# Patient Record
Sex: Male | Born: 1975
Health system: Midwestern US, Community
[De-identification: ages and names within clinical notes are randomized; demographics above are authoritative.]

## PROBLEM LIST (undated history)

## (undated) DIAGNOSIS — I639 Cerebral infarction, unspecified: Secondary | ICD-10-CM

## (undated) DIAGNOSIS — J45909 Unspecified asthma, uncomplicated: Secondary | ICD-10-CM

## (undated) DIAGNOSIS — R569 Unspecified convulsions: Secondary | ICD-10-CM

## (undated) HISTORY — PX: APPENDECTOMY: SHX54

---

## 2006-11-04 ENCOUNTER — Ambulatory Visit: Payer: Self-pay | Admitting: Psychiatry

## 2006-11-05 ENCOUNTER — Inpatient Hospital Stay (HOSPITAL_COMMUNITY): Admission: RE | Admit: 2006-11-05 | Discharge: 2006-11-07 | Payer: Self-pay | Admitting: Psychiatry

## 2007-04-04 ENCOUNTER — Inpatient Hospital Stay (HOSPITAL_COMMUNITY): Admission: RE | Admit: 2007-04-04 | Discharge: 2007-04-07 | Payer: Self-pay | Admitting: Psychiatry

## 2007-04-14 ENCOUNTER — Ambulatory Visit: Payer: Self-pay | Admitting: Psychiatry

## 2007-04-26 ENCOUNTER — Ambulatory Visit: Payer: Self-pay | Admitting: *Deleted

## 2007-04-26 ENCOUNTER — Emergency Department (HOSPITAL_COMMUNITY): Admission: EM | Admit: 2007-04-26 | Discharge: 2007-04-26 | Payer: Self-pay | Admitting: Internal Medicine

## 2007-04-26 ENCOUNTER — Inpatient Hospital Stay (HOSPITAL_COMMUNITY): Admission: AD | Admit: 2007-04-26 | Discharge: 2007-04-28 | Payer: Self-pay | Admitting: *Deleted

## 2008-04-27 IMAGING — CR DG CHEST 2V
2 series · 2 of 2 positions shown · non-contrast
Comparison: None

CLINICAL DATA: Suicidal.  Left chest pain.  History smoking.

CHEST - 2 VIEW

[w chest pa]
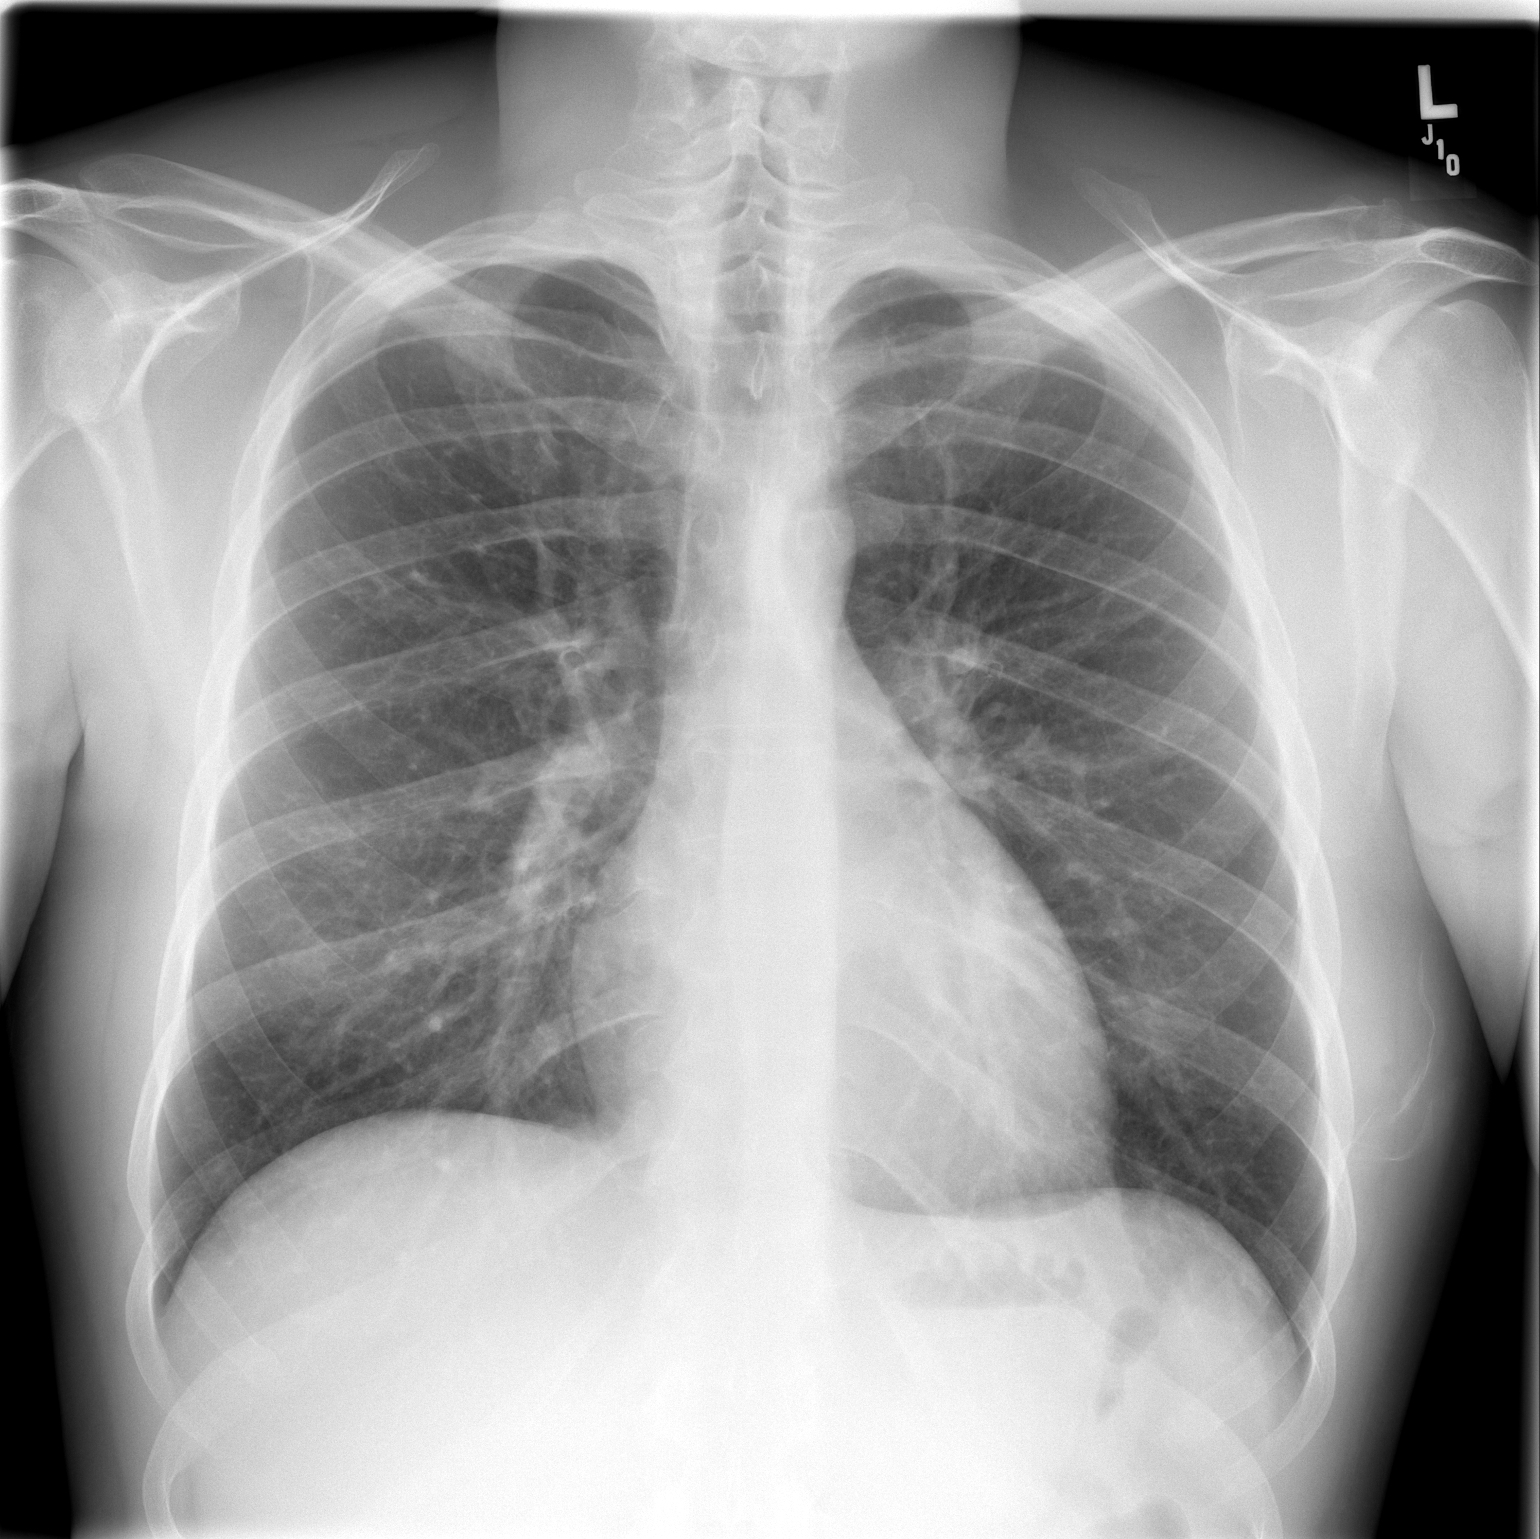

[w chest lat]
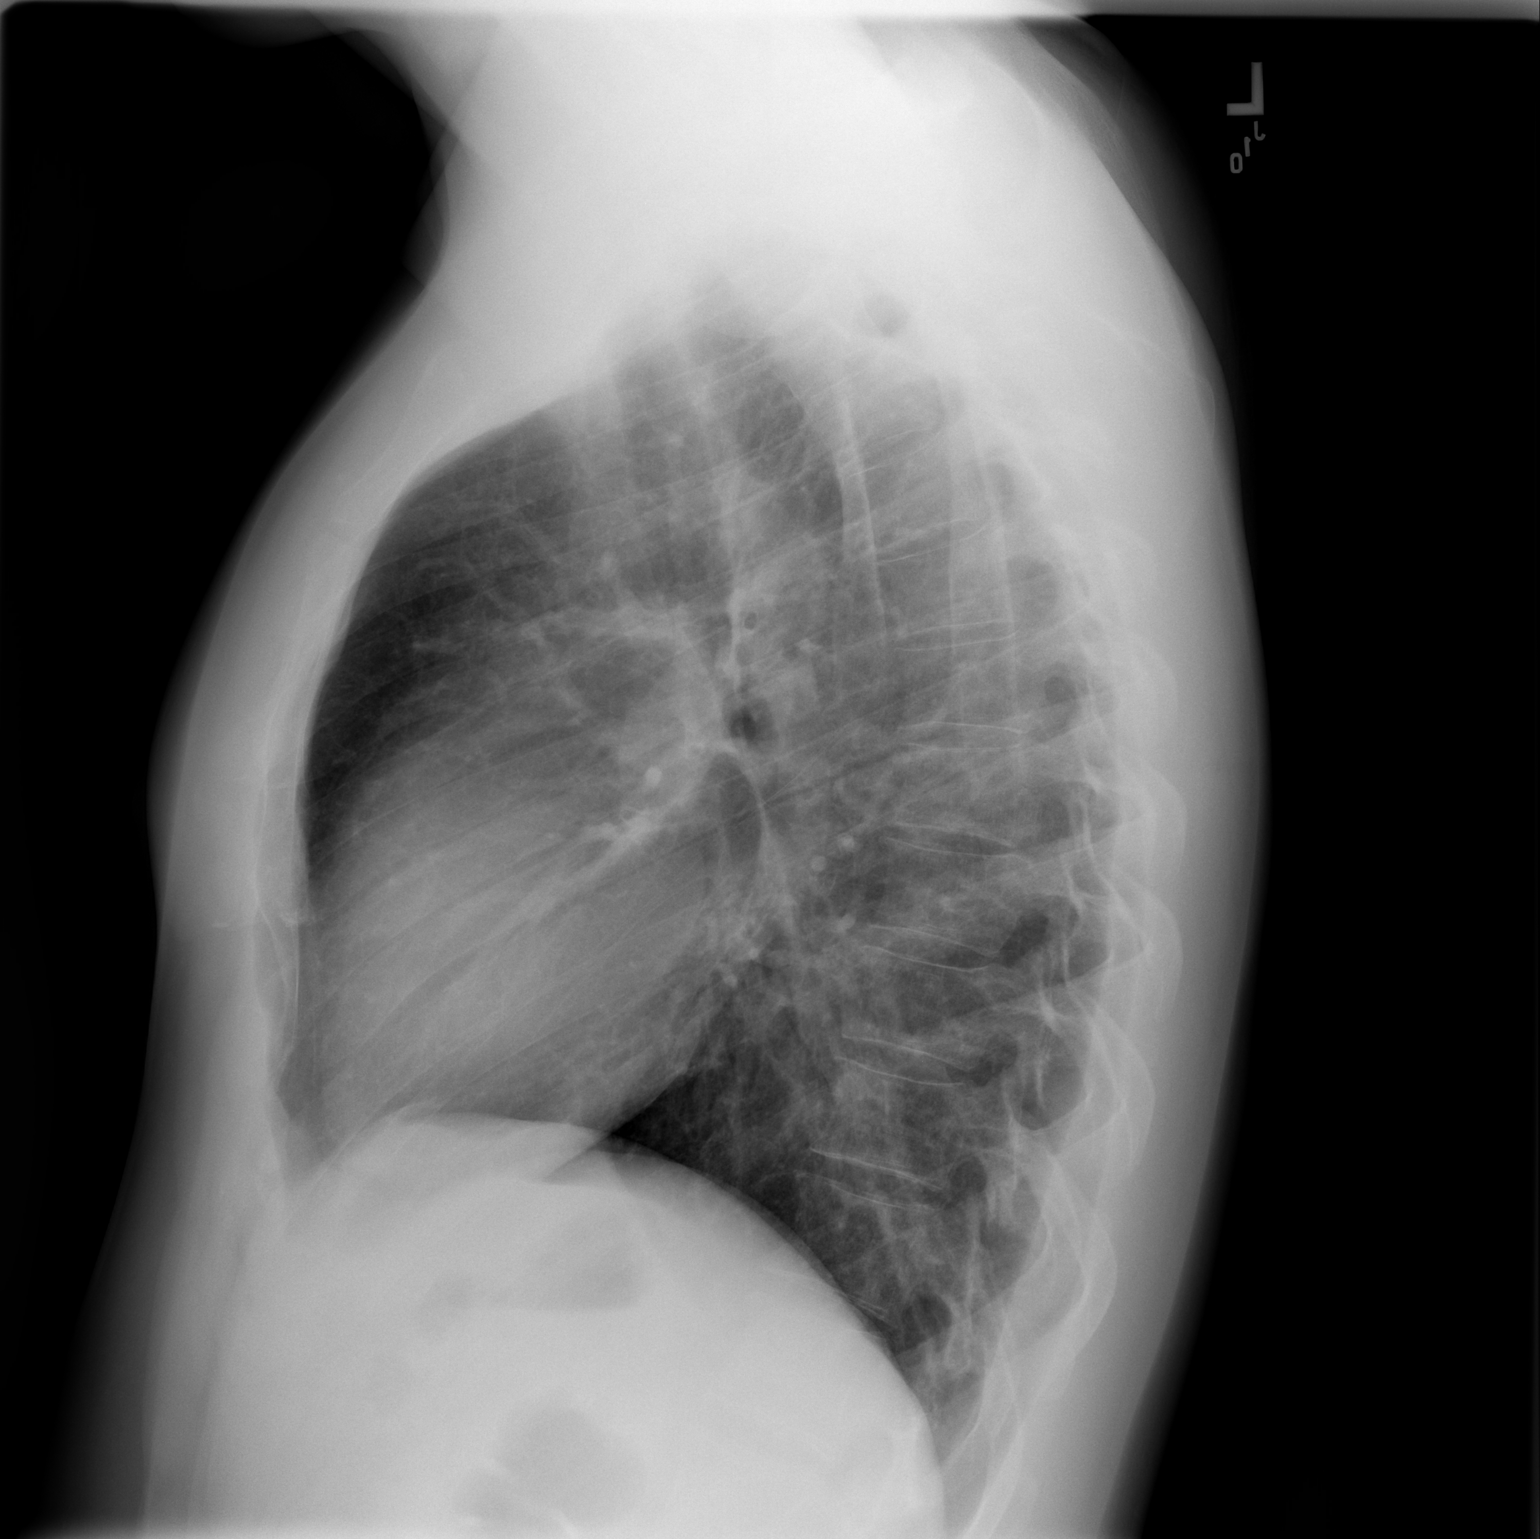

[2 of 2 positions shown; findings below may reference images not displayed]

FINDINGS: Midline trachea.  Normal heart size and mediastinal
contours.

Sharp costophrenic angles.  No pneumothorax.    Clear lungs.
IMPRESSION: No acute cardiopulmonary disease.

## 2010-05-19 NOTE — H&P (Signed)
Fernando [name], Fernando [name]                ACCOUNT NO.:  [id number]   MEDICAL RECORD NO.:  [id number]          PATIENT TYPE:  IPS   LOCATION:  0402                          FACILITY:  [facility]   PHYSICIAN:  Anselm Jungling, MD  DATE OF BIRTH:  11-29-1975   DATE OF ADMISSION:  11/05/2006  DATE OF DISCHARGE:                       PSYCHIATRIC ADMISSION ASSESSMENT   This is a voluntary admission to the services of Dr. Geralyn Flash.   This is a 35 year old single white male.  Apparently, he called 911  yesterday reporting that he was suicidal.  This was due to a recent job  lost and he lost his job because of his cocaine dependency.  He  presented to the [facility] for assessment and could not contact for  safety.  The sheriffs were called and he was brought here to the  [facility].   PAST PSYCHIATRIC HISTORY:  He has had prior inpatient stays at [facility] and [facility].  He was not forthcoming with anymore details.   SOCIAL HISTORY:  He is a high school graduate in 1993.  He has never  married.  He does have a 32-year-old son and a 25-year-old daughter.  He  has been employed as a Nutritional therapist.  He last worked approximately a month  ago.   FAMILY HISTORY:  He denies any history for mental illness.   ALCOHOL AND DRUG HISTORY:  He smokes 1 pack per day.  He, again, was not  forthcoming regarding his cocaine use but he reported a daily habit of  300 dollars a day recently.   PRIMARY CARE Alayzha An:  He does not have one.   MEDICAL PROBLEMS:  He is reported to have asthma.  No recent issues with  it.   MEDICATIONS:  He has been prescribed Seroquel and Prozac in the past  with benefit.  He has not taken in quite some time.   DRUG ALLERGIES:  HE HAS NO KNOWN DRUG ALLERGIES.   POSITIVE PHYSICAL FINDINGS:  Reveals a well-developed, well-nourished  male in no acute distress.  His vital signs on admission show he is 74  inches tall, weighs 184.5.  Temperature is 97.4.  Blood  pressure is  132/83 to 144/88.  Pulse was 65.  Respirations are 24.  He is status  post an appendectomy in 1993.  He does have occasional asthma issues but  he has not had any recent medications.   LABS:  His labs are pending.   MENTAL STATUS EXAM:  He is somewhat drowsy.  He is appropriately  groomed, dressed, and nourished.  His speech is a little slow.  His mood  is depressed.  He is somewhat withdrawn.  His affect is congruent.  Thought processes are clear, rational, and goal oriented.  He reports he  would like to get some help.  Judgment and insight are intact.  Concentration and memory are intact.  Intelligence is at least average.  He reports that he is having auditory hallucinations.  This consists of  screaming in his head.  He does feel suicidal.  He is not homicidal.  AXIS I DIAGNOSES:  1. Cocaine dependence.  2. Substance abuse-induced depression.   AXIS II:  Deferred.   AXIS III:  History of asthma.   AXIS IV:  Severe issues with occupational, housing, and economic issues.   PLAN:  Admit for safety and stabilization.  We will help support him  through his withdrawal from cocaine.  We will restart his medications.  Toward that end, he was started on Prozac 20 mg p.o. daily, Seroquel 100  mg at h.s., Seroquel 50 mg q.4. h. p.r.n.  He is hoping that we can help  him identify a long-term drug rehab program and that will be addressed  on Monday when the case managers return.   ESTIMATED LENGTH OF STAY:  Four to 5 days.      Mickie Leonarda Salon, P.A.-C.      Anselm Jungling, MD  Electronically Signed    MD/MEDQ  D:  11/05/2006  T:  11/06/2006  Job:  [id number]

## 2010-05-19 NOTE — Discharge Summary (Signed)
Fernando [name], TROSTEL                ACCOUNT NO.:  [id number]   MEDICAL RECORD NO.:  [id number]          PATIENT TYPE:  IPS   LOCATION:  0501                          FACILITY:  [facility]   PHYSICIAN:  Jasmine Pang, M.D. DATE OF BIRTH:  1975/12/08   DATE OF ADMISSION:  11/05/2006  DATE OF DISCHARGE:  11/07/2006                               DISCHARGE SUMMARY   IDENTIFICATION:  A 35 year old single white male who was admitted from  the [facility].   HISTORY OF PRESENT ILLNESS:  The patient called the Sheriff's office for  transport to the [facility].  He was having suicidal ideation.  He  states he had lost his job recently due to cocaine dependency.  Per  [facility], the patient could not contract for safety.  He had been  thinking of overdosing on his meds or sleeping pills.  He has been  admitted in the past at [facility] in [facility].  He  admits to using cocaine for several years.  He smokes 1-pack per day  cigarettes.  He does not currently have a psychiatrist.  He admits he  stopped taking his medications Seroquel and Prozac.  He states they were  helping him and he is unclear why he stopped them.  He has no known drug  allergies.   PHYSICAL FINDINGS:  There were no acute physical problems noted.   ADMISSION LABORATORIES:  Urine drug screen was positive for marijuana  and cocaine.  Urinalysis was negative.  TSH was within normal limits at  1.380.  Comprehensive metabolic panel was grossly within normal limits  except for a slightly elevated glucose of 108 and an elevated bilirubin  at 1.6 (0.3-1.2).  CBC was within normal limits.   HOSPITAL COURSE:  Upon admission, the patient was restarted on his  Seroquel 100 mg p.o. q.h.s. and Prozac 20 mg p.o. daily.  He states this  had been helpful for him in the past and he is not clear why he stopped  it.  He was also started on Symmetrel 100 mg p.o. b.i.d. for cocaine  craving.  He was also started on  Seroquel 50 mg p.o. q.4h. p.r.n.  anxiety or agitation.  He was given a nicotine patch 21 mg as per  smoking cessation protocol.  On November 06, 2006, the patient was  agitated.  He was given Vistaril 50 mg now.  He did not like the way the  p.r.n. Seroquel made him feel.  He complained he was too sedate on this.  The patient was initially very sleepy with poor eye contact in session  with me.  He was non-spontaneous and unable to answer many questions.  He states he has been using cocaine for multiple years.  He states he  has been on Seroquel 100 mg daily and Prozac 40 mg daily and feels these  meds were helpful.  He also wants drug rehab.  He states he hears  voices, sometimes screaming.  As hospitalization progressed, the  patient continued to be somewhat reserved.  He signed a 72-hour request  for  discharge.  He was very focused about wanting to go home.  He did  not want to talk with the PA or me.  He denied suicidal or homicidal  ideation.  He denied any auditory or visual hallucinations.  He did  report cravings for cocaine.  On November 07, 2006, mental status had  improved from admission status.  The patient was friendly and  cooperative with good eye contact.  Speech was normal rate and flow.  Psychomotor activity within normal limits.  Mood euthymic.  Affect wide  range.  There was no suicidal or homicidal ideation.  No thoughts of  self-injurious behavior.  No auditory or visual hallucinations.  No  paranoia or delusions.  Thoughts were logical and goal-directed.  Thought content no predominant theme.  Cognitive was grossly back to  baseline.  It was felt the patient was safe to be discharged today.  He  will be seen at the [facility] for followup.  The patient was not  interested anymore in going to a rehab program from the hospital.  He  stated he could do this on his own at a later date.   DISCHARGE DIAGNOSES:  AXIS I:  Cocaine dependence.  Mood disorder, not   otherwise specified.  AXIS II:  None.  AXIS III:  Asthma.  AXIS IV:  Severe (occupational problem, housing problem, economic  problem, burden of psychiatric and chemical dependence illness, burden  of asthma).  AXIS V:  Global assessment of functioning was 50 upon discharge.  Global  assessment of functioning was 35 upon admission.  Global assessment of  functioning was 65 highest past year.   DISCHARGE PLAN:  There were no specific activity level or dietary  restrictions.   POST-HOSPITAL CARE PLANS:  The patient will return to the [facility] for a reentry appointment.  This appointment is being arranged by  our case manager.   DISCHARGE MEDICATIONS:  1. Seroquel 100 mg at bedtime and 1/2 pill every 4 hours if needed for      agitation.  2. Prozac 20 mg daily.  3. Amantadine 100 mg twice daily.      Jasmine Pang, M.D.  Electronically Signed     BHS/MEDQ  D:  11/07/2006  T:  11/07/2006  Job:  [id number]

## 2010-05-19 NOTE — H&P (Signed)
[name], [name]                ACCOUNT NO.:  [id number]   MEDICAL RECORD NO.:  [id number]          PATIENT TYPE:  IPS   LOCATION:  0506                          FACILITY:  [facility]   PHYSICIAN:  Geoffery Lyons, M.D.      DATE OF BIRTH:  10-26-1975   DATE OF ADMISSION:  04/04/2007  DATE OF DISCHARGE:                       PSYCHIATRIC ADMISSION ASSESSMENT   TIME OF ASSESSMENT:  1430 hours.   IDENTIFYING INFORMATION:  A 35 year old white male, single.  This is a  voluntary admission.   HISTORY OF PRESENT ILLNESS:  Second [facility] admission for this patient who  reports that after being discharged here in November of 2008 he was able  to remain abstinent from the heroin and the cocaine that he was using at  that time but then he began to drink alcohol.  He feels that one  aggravating factor was that he is out of work, lost his job due to  cocaine use and has not been able to retain any steady employment, now  drinking up to a case of beer on some days and drinking most every day  of the week and scared himself when he began to have some blackouts.  Has been having suicidal thoughts of possibly overdosing on something  and has a history of thoughts in the past of overdosing and reports that  he has a history of one prior overdose.  He was previously admitted here  at [facility] to the service of Dr. Milford Cage on November 1 through  November 07, 2006, and was referred to [facility] for  followup but says that he did not take any of his medications after he  left here and neither did he keep any followup appointments.  He was  discharged on Seroquel 100 mg p.o. daily at bedtime, Prozac 20 mg and  amantadine 100 mg b.i.d.  Reports that he did not take these.   SOCIAL HISTORY:  This is a single white male, unemployed with financial  stressors apparently had retained some employment after his discharge  from here and then lost his job again in January which he said led him  to heavy  drinking.  Also stressed by not being able to see his children  as much as he would like.  No known legal charges.   FAMILY HISTORY:  Is remarkable for father with addiction to alcohol and  drugs.   MEDICAL HISTORY:  No regular primary care Fernando [name].  Only medical  problem is asthma and uses an inhaler rarely.   MEDICATIONS:  Are none.   DRUG ALLERGIES:  Are none.   POSITIVE PHYSICAL FINDINGS:  He has declined to participate in a full  physical exam, basically wants to stay in bed and sleep today but he has  been up walking around.  He appears to be healthy with a normal motor,  has been socializing and did use the phone earlier.  Five feet 2 inches  tall, 205 pounds, temperature 97.7, pulse 68, respirations 20, blood  pressure 131/79.   DIAGNOSTIC STUDIES:  Are currently pending.   MENTAL STATUS EXAM:  He has been awake from time to time today but has  offered little by way of history.  Affect is appropriate at this time.  Speech minimal but normal in form and production.  Mood is depressed.  He is reporting that he mainly just wants to sleep today, offers little  by way of history.  Does admit to having suicidal thoughts and felt good  about staying off the cocaine and heroin but feels bad now that he has  been drinking a lot of alcohol, feels he needs some long term treatment  and is asking for help.  Endorses having suicidal thoughts prior to  admission.  No active suicidal thoughts today.  No homicidal thought.  Cognition is fully intact.  Insight is adequate.  Impulse control and  judgment within normal limits.  He is denying any homicidal thoughts.  No evidence of psychosis, delirium or confusion.   AXIS I:  Depressive disorder NOS, EtOH abuse, rule out dependence,  polysubstance abuse in partial remission.  AXIS II:  Deferred.  AXIS III:  Asthma.  AXIS IV:  Unemployment.  AXIS V:  Current 48, past year not known.   PLAN:  Is to voluntarily admit the patient with a  goal of a safe detox  within 5 days and we are going to work to alleviate his suicidal  thoughts.  He would like to get into a long term treatment program and  we will do our best to help him with those resources.  Meanwhile, he is  on Librium 25 mg q.4 h, p.r.n. for any withdrawal symptoms.  He is  getting folic acid 1 mg daily and thiamine 100 mg daily and basic labs  are currently pending.  Estimated length of stay is 5 days.      Margaret A. Scott, N.P.      Geoffery Lyons, M.D.  Electronically Signed    MAS/MEDQ  D:  04/05/2007  T:  04/05/2007  Job:  [id number]

## 2010-05-22 NOTE — Discharge Summary (Signed)
NAMEHJALMER, IOVINO                ACCOUNT NO.:  [id number]   MEDICAL RECORD NO.:  [id number]          PATIENT TYPE:  IPS   LOCATION:  0404                          FACILITY:  [facility]   PHYSICIAN:  Jasmine Pang, M.D. DATE OF BIRTH:  03-Aug-1975   DATE OF ADMISSION:  04/26/2007  DATE OF DISCHARGE:  04/28/2007                               DISCHARGE SUMMARY   IDENTIFICATION:  This is 35 year old single male who was admitted on a  voluntary basis on April 27, 2007.   HISTORY OF PRESENT ILLNESS:  The patient had a history of depression  with suicidal ideation.  He had thoughts of death.  He has been  noncompliant with his meds.  He is also been abusing substances.  He had  a recent severe spider bite, which was treated in the medical setting.  He was last here  in November 2008.   PAST PSYCHIATRIC HISTORY:  The patient was here in November 2008 and  again in March 2009 for substance abuse.   ALCOHOL DRUG HISTORY:  As above.   MEDICAL PROBLEMS:  Asthma.   MEDICATIONS:  1. Seroquel 400 mg p.o. q.h.s., but he has not been compliant with      this.  2. Albuterol inhaler.   DRUG ALLERGIES:  No known drug allergies.   PHYSICAL FINDINGS:  There were no acute physical or medical problems  noted.  His physical exam was done in the [facility] ED.   ADMISSION LABORATORY:  CBC was within normal limits.  Acetaminophen  level was less than 10.  Alcohol level less than 5.  Salicylate less  than 4.  B-MET was within normal limits.  UDS positive for cocaine.   HOSPITAL COURSE:  Upon admission, the patient was started on trazodone  100 mg p.o. q.h.s. p.r.n. insomnia.  He was also started on Librium 25  mg p.o. q.6 h. p.r.n. symptoms of withdrawal, anxiety or agitation.  He  on April 26, 2007, he was started on Seroquel 100 mg p.o. q.h.s.  He was  also started on Bactrim 2 tablets p.o. b.i.d. x5 days to prevent  infection from the spider bite.  The patient initially in individual  sessions was  friendly and cooperative with exam.  He seemed somewhat  indifferent to the presence of the doctor.  He had a bland affect.  He  was nonspontaneous to statements.  There was no delusional statements.  He could not tell why he was here except it was the hospitals idea due  to the spider bite.  He indicates he is okay with being here.  He  requests his usual meds.  His hospitalization progressed, the patient  improved markedly.  On April 28, 2007, his sleep was good and appetite  was good.  Mood was less depressed, less anxious.  Affect consistent  with mood.  No suicidal or homicidal ideation.  No auditory or visual  hallucinations.  No paranoia or delusions.  Thoughts were logical and  goal-directed.  Thought content, no predominant theme.  Cognitive was  grossly back to baseline.  The patient wanted to  go home today.  He was  felt to be safe for discharge.   DISCHARGE DIAGNOSES:  Axis I:  Mood disorder, not otherwise specified  and polysubstance abuse.  Axis II:  None.  Axis III:  Asthma.  Axis IV:  Moderate (problems with primary support group, other  psychosocial problems, burden of psychiatric illness, burden of chemical  dependence illness).  Axis V:  Global assessment of functioning was 50 upon discharge.  GAF  was 35-40 upon admission.  GAF highest past year was 60-65   DISCHARGE PLAN:  There was no specific activity level or dietary  restrictions.   POSTHOSPITAL CARE PLANS:  The patient will be seen at [facility] in  [facility] on May 01, 2007, at 10 a.m.   DISCHARGE MEDICATIONS:  1. Bactrim DS 2 tablets twice a day until finished, #14 were given.  2. Seroquel 100 mg p.o. bedtime.      Jasmine Pang, M.D.  Electronically Signed     BHS/MEDQ  D:  05/24/2007  T:  05/25/2007  Job:  [id number]

## 2010-05-22 NOTE — Discharge Summary (Signed)
Fernando [name], Fernando [name]                ACCOUNT NO.:  [id number]   MEDICAL RECORD NO.:  [id number]          PATIENT TYPE:  IPS   LOCATION:  0506                          FACILITY:  [facility]   PHYSICIAN:  Geoffery Lyons, M.D.      DATE OF BIRTH:  08-23-1975   DATE OF ADMISSION:  04/04/2007  DATE OF DISCHARGE:  04/07/2007                               DISCHARGE SUMMARY   CHIEF COMPLAINT:  This was the second admission to [facility] for this 35 year old white male single voluntarily admitted.  He  had been discharged November 2008.  He was able to remain abstinent from  heroin and cocaine, but he began to drink alcohol.  He endorsed he was  out of work, lost his job due to cocaine.  He has not been able to have  steady employment, drinking up to a case of beer on some days and  drinking most every day of the week.  Endorsed he began having some  blackouts, having suicidal thoughts with plans to overdose.   PAST PSYCHIATRIC HISTORY:  He previously admitted to [facility] in November 1 through November 3 in 2008, referred to  [facility].  He was discharged on Seroquel, Prozac and Symmetrel.  He did not pursue follow-up through [facility].   ALCOHOL AND DRUG HISTORY:  As already stated, persistent use of heroin,  cocaine and alcohol.   MEDICAL HISTORY:  Asthma.   MEDICATIONS:  Inhaler occasionally.   PHYSICAL EXAMINATION:  Failed to show any acute findings.   LABORATORY DATA:  Laboratory workup not available in the chart.   MENTAL STATUS EXAM:  Exam reveals an alert cooperative male.  Not as  spontaneous, reserved, guarded, offers little information.  Speech was  minimal in production but normal in form.  Mood depressed.  Affect  depressed.  Feeling overwhelmed.  Endorsed suicidal thoughts.  Wanting  to stay off the cocaine and heroin, but now drinking a lot of alcohol.  No active suicidal thoughts.  No homicidal ideations.  No delusions.  No  hallucinations.  Cognition well-preserved.   ADMISSION DIAGNOSES:  AXIS I: Alcohol, cocaine, opiate abuse, rule out  dependence.  Depressive disorder not otherwise specified.  AXIS II:  No diagnosis.  AXIS III:  Asthma.  AXIS IV: Moderate.  AXIS IV:  Upon admission 35.  Highest global assessment of functioning  (GAF) in the last year is 60.   COURSE IN THE HOSPITAL:  He was admitted.  He was started individual and  group psychotherapy.  He was given Ambien for sleep.  We tried to detox  with Librium, but he did not do too well on it.  He had done better on  Ativan for detox.  We went ahead and switched to Ativan, and we pursued  the Seroquel further.  As already stated, a 35 year old male, single,  two kids, has a girlfriend who is supportive, working as a Nutritional therapist.  Since he was in the hospital on November 2008, he has not used any  cocaine or heroin.  Does endorse he has  continued to drink alcohol, a  case every 2 days for the last several months.  More depressed.  No  energy, no motivation.  Overwhelmed with the way he is feeling,  affecting his ability to get another job.  Endorsed he was becoming  hopeless, helpless with suicidal thoughts.  He had been at [facility] November 2008.  Also [facility] [place], and there he  was treated with bipolar.  Claimed that the Seroquel was extremely  sedating so he did not pursue it.   Initially, he was having a hard time with detox, feeling nausea, feeling  tired.  As hospitalization progressed, he was detoxed but continued to  feel exhausted.  On April 2, he spoke with his girlfriend who confirmed  that his father was back in the hospital with cirrhoses.  He became very  upset, tearful, wanting to go.  Increased anxiety, agitation, but he was  committed that he was better off focusing on himself.  On April 3, he  felt he was ready to go home.  He endorsed no active suicidal or  homicidal ideas.  He had been __________ the  day before.  He said he was  ready to go.  He was committed to outpatient treatment but felt that he  really needed to be home and be there for his father.   DISCHARGE DIAGNOSES:  AXIS I:  Cocaine and heroin dependence, in early  remission.  Alcohol dependence.  Depressive disorder not otherwise  specified.  Mood disorder NOS.  AXIS II:  No diagnosis.  Asthma.  AXIS IV:  Moderate.  AXIS V:  On discharge 50-55.   DISCHARGE MEDICATIONS:  1. Seroquel XR 400 mg at bedtime.  2. Ativan 1 mg three times a day for 1 day then one twice a day for a      day, then one daily for 1 day, then discontinue.  3. Campral 233 mg, 2 tablets three times a day.   FOLLOWUP:  Follow up at [facility], Dr. Lang Snow.      Geoffery Lyons, M.D.  Electronically Signed     IL/MEDQ  D:  05/08/2007  T:  05/08/2007  Job:  [id number]

## 2010-09-29 LAB — CBC
HCT: 48.8
Hemoglobin: 15.5
MCHC: 35
MCV: 91.2
Platelets: 230
RBC: 4.88
RDW: 12.3
RDW: 13.4
WBC: 4.9

## 2010-09-29 LAB — URINALYSIS, ROUTINE W REFLEX MICROSCOPIC
Bilirubin Urine: NEGATIVE
Ketones, ur: NEGATIVE
Nitrite: NEGATIVE
Protein, ur: NEGATIVE
pH: 6

## 2010-09-29 LAB — COMPREHENSIVE METABOLIC PANEL
AST: 39 — ABNORMAL HIGH
Albumin: 3.8
BUN: 6
Chloride: 101
Creatinine, Ser: 1.15
GFR calc Af Amer: >60
Total Bilirubin: 2.9 — ABNORMAL HIGH
Total Protein: 6.8

## 2010-09-29 LAB — DIFFERENTIAL
Basophils Absolute: 0
Basophils Relative: 0
Lymphocytes Relative: 19
Monocytes Relative: 10
Neutro Abs: 4.1
Neutrophils Relative %: 69

## 2010-09-29 LAB — POCT I-STAT, CHEM 8
Chloride: 103
HCT: 47
Hemoglobin: 16
Potassium: 3.7
Sodium: 142

## 2010-09-29 LAB — BENZODIAZEPINE, QUANTITATIVE, URINE
Alprazolam (GC/LC/MS), ur confirm: NEGATIVE
Flurazepam GC/MS Conf: NEGATIVE
Nordiazepam GC/MS Conf: NEGATIVE
Oxazepam GC/MS Conf: 260 ng/mL

## 2010-09-29 LAB — ACETAMINOPHEN LEVEL: Acetaminophen (Tylenol), Serum: <10 — ABNORMAL LOW

## 2010-09-29 LAB — ETHANOL: Alcohol, Ethyl (B): <5

## 2010-09-29 LAB — DRUGS OF ABUSE SCREEN W/O ALC, ROUTINE URINE
Cocaine Metabolites: NEGATIVE
Methadone: NEGATIVE
Opiate Screen, Urine: NEGATIVE
Phencyclidine (PCP): NEGATIVE
Propoxyphene: NEGATIVE

## 2010-09-29 LAB — RPR: RPR Ser Ql: NONREACTIVE

## 2010-09-29 LAB — TSH: TSH: 0.563

## 2010-09-29 LAB — RAPID URINE DRUG SCREEN, HOSP PERFORMED
Cocaine: POSITIVE — AB
Tetrahydrocannabinol: NOT DETECTED

## 2010-10-13 LAB — URINALYSIS, ROUTINE W REFLEX MICROSCOPIC
Bilirubin Urine: NEGATIVE
Hgb urine dipstick: NEGATIVE
Specific Gravity, Urine: 1.024
Urobilinogen, UA: 1

## 2010-10-13 LAB — DRUGS OF ABUSE SCREEN W/O ALC, ROUTINE URINE
Barbiturate Quant, Ur: NEGATIVE
Benzodiazepines.: NEGATIVE
Cocaine Metabolites: POSITIVE — AB
Methadone: NEGATIVE
Opiate Screen, Urine: NEGATIVE

## 2010-10-13 LAB — COMPREHENSIVE METABOLIC PANEL
ALT: 19
Alkaline Phosphatase: 61
CO2: 30
Chloride: 102
GFR calc non Af Amer: >60
Glucose, Bld: 108 — ABNORMAL HIGH
Potassium: 3.8
Sodium: 139
Total Bilirubin: 1.6 — ABNORMAL HIGH
Total Protein: 6.2

## 2010-10-13 LAB — TSH: TSH: 1.38

## 2010-10-13 LAB — CBC
HCT: 42.9
Hemoglobin: 15.1
RBC: 4.66

## 2019-11-19 ENCOUNTER — Emergency Department (HOSPITAL_COMMUNITY)
Admission: EM | Admit: 2019-11-19 | Discharge: 2019-11-20 | Disposition: A | Discharge status: Home / Self Care | Payer: Self-pay | Attending: Emergency Medicine | Admitting: Emergency Medicine

## 2019-11-19 ENCOUNTER — Encounter (HOSPITAL_COMMUNITY): Payer: Self-pay

## 2019-11-19 DIAGNOSIS — F17210 Nicotine dependence, cigarettes, uncomplicated: Secondary | ICD-10-CM | POA: Insufficient documentation

## 2019-11-19 DIAGNOSIS — J45909 Unspecified asthma, uncomplicated: Secondary | ICD-10-CM | POA: Insufficient documentation

## 2019-11-19 DIAGNOSIS — F1910 Other psychoactive substance abuse, uncomplicated: Secondary | ICD-10-CM | POA: Insufficient documentation

## 2019-11-19 HISTORY — DX: Unspecified convulsions: R56.9

## 2019-11-19 HISTORY — DX: Unspecified asthma, uncomplicated: J45.909

## 2019-11-19 LAB — RAPID URINE DRUG SCREEN, HOSP PERFORMED
Amphetamines: POSITIVE — AB
Barbiturates: NOT DETECTED
Benzodiazepines: NOT DETECTED
Cocaine: NOT DETECTED
Opiates: NOT DETECTED
Tetrahydrocannabinol: POSITIVE — AB

## 2019-11-19 LAB — COMPREHENSIVE METABOLIC PANEL
ALT: 23 U/L (ref 0–44)
AST: 25 U/L (ref 15–41)
Albumin: 3.6 g/dL (ref 3.5–5.0)
Alkaline Phosphatase: 70 U/L (ref 38–126)
Anion gap: 8 (ref 5–15)
BUN: 11 mg/dL (ref 6–20)
CO2: 25 mmol/L (ref 22–32)
Calcium: 8.8 mg/dL — ABNORMAL LOW (ref 8.9–10.3)
Chloride: 105 mmol/L (ref 98–111)
Creatinine, Ser: 0.81 mg/dL (ref 0.61–1.24)
GFR, Estimated: >60 mL/min (ref >60)
Glucose, Bld: 120 mg/dL — ABNORMAL HIGH (ref 70–99)
Potassium: 3.6 mmol/L (ref 3.5–5.1)
Sodium: 138 mmol/L (ref 135–145)
Total Bilirubin: 1.1 mg/dL (ref 0.3–1.2)
Total Protein: 6.8 g/dL (ref 6.5–8.1)

## 2019-11-19 LAB — CBC
HCT: 44.3 % (ref 39.0–52.0)
Hemoglobin: 14.6 g/dL (ref 13.0–17.0)
MCH: 31.1 pg (ref 26.0–34.0)
MCHC: 33 g/dL (ref 30.0–36.0)
MCV: 94.5 fL (ref 80.0–100.0)
Platelets: 280 10*3/uL (ref 150–400)
RBC: 4.69 MIL/uL (ref 4.22–5.81)
RDW: 12.2 % (ref 11.5–15.5)
WBC: 4.5 10*3/uL (ref 4.0–10.5)
nRBC: 0 % (ref 0.0–0.2)

## 2019-11-19 LAB — ETHANOL: Alcohol, Ethyl (B): <10 mg/dL (ref <10)

## 2019-11-19 LAB — ACETAMINOPHEN LEVEL: Acetaminophen (Tylenol), Serum: <10 ug/mL — ABNORMAL LOW (ref 10–30)

## 2019-11-19 LAB — SALICYLATE LEVEL: Salicylate Lvl: <7 mg/dL — ABNORMAL LOW (ref 7.0–30.0)

## 2019-11-19 NOTE — ED Triage Notes (Addendum)
Pt reports coming in due to [facility] facility requiring him to come in the ER for detox. Pt wants to detox from heroin and meth. Pt reports having seizures, last seizure Wednesday 11/10.

## 2019-11-19 NOTE — Social Work (Signed)
CSW received call that Pt and family wished to speak with social worker in lobby. CSW met with Pt and family in lobby and explained that until Pt is seen by provider, no referrals to detox could be made.  Per family, Pt was previously in [facility] and completed 7 day inpatient detox and was then moved to a rehab setting where he began experiencing seizures. Pt was sent to [facility] from rehab. Upon discharge from hospital in [place] Pt was not returned to [facility] and subsequently had a return to use.  CSW will continue to follow for social work needs once Pt is roomed in ED.

## 2019-11-19 NOTE — Progress Notes (Signed)
CSW's shift is ending and Pt has not been roomed.  Should Pt be deemed medically appropriate for discharge during the overnight shift (before the 7 am CSW arrives)  Please call [facility] to inquire if Pt is eligible to be resent to facility. @ [phone] If so transportation can be arranged via [facility] [employer] After Hours @[phone]   CSW will leave update for first shift CSW.

## 2019-11-20 MED ORDER — LEVETIRACETAM 500 MG PO TABS
500.0000 mg | ORAL_TABLET | Freq: Once | ORAL | Status: AC
  Administered 2019-11-20: 500 mg via ORAL
  Filled 2019-11-20: qty 1

## 2019-11-20 MED ORDER — LEVETIRACETAM 500 MG PO TABS: 500.0000 mg | ORAL_TABLET | Freq: Two times a day (BID) | ORAL | 0 refills | Status: DC

## 2019-11-20 NOTE — Discharge Instructions (Addendum)
Labs attached and prescription for keppra-- given dose tonight prior to leaving ED.

## 2019-11-20 NOTE — ED Provider Notes (Signed)
[facility] EMERGENCY DEPARTMENT Provider Note   CSN: [id number] Arrival date & time: 11/19/19  1252     History Chief Complaint  Patient presents with  . Addiction Problem    Fernando [name] is a 44 y.o. male.  The history is provided by the patient and medical records.   44 year old male with history of asthma, longstanding heroin and methamphetamine use with new diagnosis of seizure disorder, presenting to the ED for detox.  Per family at bedside, he initially went to [facility] earlier this month and completed week long detox program there.  He was moved to the rehabilitation side and had a seizure.  He was sent to [facility] where he underwent overnight stay including CT of the head and MRI which did not show any acute findings.  [facility] was [facility] and had subsequent seizure so was then transferred to [facility].  There he had neurology evaluation and was started on Keppra 500 mg twice daily.  Family reports once discharged from there they were not aware that he was started on this new medication so has not had it since then.  Attempted to take [facility] back to [facility] but his bed had been given up.  States they talk to someone from there today and they encouraged to bring him here for medical evaluation prior to returning to [facility].  Patient has not used meth or heroin in 3 days.  He does not drink alcohol.  Family reports 20+ year history of heroin abuse.  Last seizure 6 days ago on 11/14/19.  Past Medical History:  Diagnosis Date  . Asthma   . Seizures (HCC)     There are no problems to display for this patient.      No family history on file.  Social History   Tobacco Use  . Smoking status: Current Every Day Smoker    Packs/day: 2.00    Years: 15.00    Pack years: 30.00    Types: Cigarettes  . Smokeless tobacco: Current User  Substance Use Topics  . Alcohol use: Not on file  . Drug use: Not on file    Home Medications Prior  to Admission medications   Not on File    Allergies    Patient has no allergy information on record.  Review of Systems   Review of Systems  Psychiatric/Behavioral:       Detox  All other systems reviewed and are negative.   Physical Exam Updated Vital Signs BP 99/64 (BP Location: Left Arm)   Pulse (!) 106   Temp 98.4 F (36.9 C) (Oral)   Resp 17   Ht 6\' 2"  (1.88 m)   Wt 83.9 kg   SpO2 96%   BMI 23.75 kg/m   Physical Exam Vitals and nursing note reviewed.  Constitutional:      Appearance: He is well-developed.     Comments: Disheveled, sleeping throughout most of exam  HENT:     Head: Normocephalic and atraumatic.  Eyes:     Conjunctiva/sclera: Conjunctivae normal.     Pupils: Pupils are equal, round, and reactive to light.  Cardiovascular:     Rate and Rhythm: Normal rate and regular rhythm.     Heart sounds: Normal heart sounds.  Pulmonary:     Effort: Pulmonary effort is normal. No respiratory distress.     Breath sounds: Normal breath sounds. No rhonchi.  Abdominal:     General: Bowel sounds are normal.  Palpations: Abdomen is soft.  Musculoskeletal:        General: Normal range of motion.     Cervical back: Normal range of motion.  Skin:    General: Skin is warm and dry.  Neurological:     Mental Status: He is alert and oriented to person, place, and time.     Comments: No focal deficits, no tremors or seizure activity observed     ED Results / Procedures / Treatments   Labs (all labs ordered are listed, but only abnormal results are displayed) Labs Reviewed  COMPREHENSIVE METABOLIC PANEL - Abnormal; Notable for the following components:      Result Value   Glucose, Bld 120 (*)    Calcium 8.8 (*)    All other components within normal limits  RAPID URINE DRUG SCREEN, HOSP PERFORMED - Abnormal; Notable for the following components:   Amphetamines POSITIVE (*)    Tetrahydrocannabinol POSITIVE (*)    All other components within normal limits    ACETAMINOPHEN LEVEL - Abnormal; Notable for the following components:   Acetaminophen (Tylenol), Serum <10 (*)    All other components within normal limits  SALICYLATE LEVEL - Abnormal; Notable for the following components:   Salicylate Lvl <7.0 (*)    All other components within normal limits  ETHANOL  CBC    EKG None  Radiology No results found.  Procedures Procedures (including critical care time)  Medications Ordered in ED Medications  levETIRAcetam (KEPPRA) tablet 500 mg (has no administration in time range)    ED Course  I have reviewed the triage vital signs and the nursing notes.  Pertinent labs & imaging results that were available during my care of the patient were reviewed by me and considered in my medical decision making (see chart for details).    MDM Rules/Calculators/A&P  44 year old male presenting to the ED requesting detox from heroin and methamphetamines.  Long-term heroin use of 20+ years.  He is sleeping on assessment.  His vitals are stable.  He has not had any substance use in about 3 days.  Recent diagnosis of seizures but no seizure in the last 6 days.  Labs today are grossly reassuring.  UDS is positive for amphetamines and THC.  From medical standpoint, no further acute intervention warranted tonight.  He has not had his Keppra today so was given home dose now.  Family is wanting to return to [facility], states they were told by social work that we will arrange this here in the ER.  I have informed him that this is generally not the case as we do not manage inpatient detox.  Spoke with [facility] in [place] where patient was taken this morning-- states they are not sure who sent him to the hospital today (?daytime staff).  He does need medical clearance and copies of labs and scripts prior to returning.  Cannot guarantee bed placement until he is evaluated.  Attempted to speak with RN on staff there as well, not really able to give any further helpful  information in regards to bed placement in their facility but does remember patient from prior admission there.  Spoke with family, they were told he can return once medically cleared by provider as long as he has labs/scripts and they are desiring return there.  Safe transport was called for patient.  Final Clinical Impression(s) / ED Diagnoses Final diagnoses:  Polysubstance abuse (HCC)    Rx / DC Orders ED Discharge Orders  Ordered    levETIRAcetam (KEPPRA) 500 MG tablet  2 times daily        11/20/19 0120           Garlon Hatchet, PA-C 11/20/19 2951    Zadie Rhine, MD 11/20/19 [id number]

## 2019-11-22 ENCOUNTER — Other Ambulatory Visit: Payer: Self-pay

## 2019-11-22 ENCOUNTER — Encounter (HOSPITAL_COMMUNITY): Payer: Self-pay

## 2019-11-22 ENCOUNTER — Emergency Department (HOSPITAL_COMMUNITY)
Admission: EM | Admit: 2019-11-22 | Discharge: 2019-11-22 | Disposition: A | Discharge status: Home / Self Care | Payer: Self-pay | Attending: Emergency Medicine | Admitting: Emergency Medicine

## 2019-11-22 DIAGNOSIS — F1910 Other psychoactive substance abuse, uncomplicated: Secondary | ICD-10-CM

## 2019-11-22 DIAGNOSIS — F1110 Opioid abuse, uncomplicated: Secondary | ICD-10-CM | POA: Insufficient documentation

## 2019-11-22 DIAGNOSIS — F17210 Nicotine dependence, cigarettes, uncomplicated: Secondary | ICD-10-CM | POA: Insufficient documentation

## 2019-11-22 DIAGNOSIS — G40909 Epilepsy, unspecified, not intractable, without status epilepticus: Secondary | ICD-10-CM | POA: Insufficient documentation

## 2019-11-22 DIAGNOSIS — J45909 Unspecified asthma, uncomplicated: Secondary | ICD-10-CM | POA: Insufficient documentation

## 2019-11-22 DIAGNOSIS — F1510 Other stimulant abuse, uncomplicated: Secondary | ICD-10-CM | POA: Insufficient documentation

## 2019-11-22 HISTORY — DX: Cerebral infarction, unspecified: I63.9

## 2019-11-22 LAB — COMPREHENSIVE METABOLIC PANEL
ALT: 23 U/L (ref 0–44)
AST: 27 U/L (ref 15–41)
Albumin: 3.8 g/dL (ref 3.5–5.0)
Alkaline Phosphatase: 87 U/L (ref 38–126)
Anion gap: 9 (ref 5–15)
BUN: 13 mg/dL (ref 6–20)
CO2: 25 mmol/L (ref 22–32)
Calcium: 9 mg/dL (ref 8.9–10.3)
Chloride: 105 mmol/L (ref 98–111)
Creatinine, Ser: 1.04 mg/dL (ref 0.61–1.24)
GFR, Estimated: >60 mL/min (ref >60)
Glucose, Bld: 109 mg/dL — ABNORMAL HIGH (ref 70–99)
Potassium: 3.5 mmol/L (ref 3.5–5.1)
Sodium: 139 mmol/L (ref 135–145)
Total Bilirubin: 1.4 mg/dL — ABNORMAL HIGH (ref 0.3–1.2)
Total Protein: 7 g/dL (ref 6.5–8.1)

## 2019-11-22 LAB — ETHANOL: Alcohol, Ethyl (B): <10 mg/dL (ref <10)

## 2019-11-22 LAB — CBC
HCT: 40.8 % (ref 39.0–52.0)
Hemoglobin: 13.5 g/dL (ref 13.0–17.0)
MCH: 31.4 pg (ref 26.0–34.0)
MCHC: 33.1 g/dL (ref 30.0–36.0)
MCV: 94.9 fL (ref 80.0–100.0)
Platelets: 295 10*3/uL (ref 150–400)
RBC: 4.3 MIL/uL (ref 4.22–5.81)
RDW: 12.1 % (ref 11.5–15.5)
WBC: 5.3 10*3/uL (ref 4.0–10.5)
nRBC: 0 % (ref 0.0–0.2)

## 2019-11-22 LAB — RAPID URINE DRUG SCREEN, HOSP PERFORMED
Amphetamines: POSITIVE — AB
Barbiturates: NOT DETECTED
Benzodiazepines: NOT DETECTED
Cocaine: NOT DETECTED
Opiates: NOT DETECTED
Tetrahydrocannabinol: POSITIVE — AB

## 2019-11-22 LAB — ACETAMINOPHEN LEVEL: Acetaminophen (Tylenol), Serum: <10 ug/mL — ABNORMAL LOW (ref 10–30)

## 2019-11-22 LAB — SALICYLATE LEVEL: Salicylate Lvl: <7 mg/dL — ABNORMAL LOW (ref 7.0–30.0)

## 2019-11-22 NOTE — ED Notes (Signed)
Pt spoke with case management and verbalized understanding. Pt then eloped. Writer did not see patient leave or have an opportunity to get discharge vital signs.

## 2019-11-22 NOTE — ED Triage Notes (Signed)
Pt arrives to ED requesting approval from neurology to get into [facility] to detox from heroin and meth. Pt states last heroin use 2 weeks ago, last meth use last night. Pt states he has a hx of seizures and rehab facility is requesting clearance in order for him to get in. Pt also endorses hx of CVA w/ L sided deficits.

## 2019-11-22 NOTE — ED Provider Notes (Signed)
[facility] EMERGENCY DEPARTMENT Provider Note   CSN: [id number] Arrival date & time: 11/22/19  1457     History Chief Complaint  Patient presents with   Medical Clearance    Fernando [name] is a 44 y.o. male.  Per triage note patient presented to ED requesting medical clearance from neurology to return to [facility] behavioral health for detox from heroin and methamphetamines.  Per chart review patient has a longstanding history of heroin and methamphetamine use and was recently diagnosed with seizure disorder.  Social worker Engineer, water spoke with patient about his current situation as well as reached out to [facility] for clarification on their reasoning for medical clearance.  She reports that the [facility] will not accept the patient until he has a full neurological work-up in office with the neurologist with whom [facility] had arranged an appointment with for patient and cleared by that provider.she also provided the patient with a Narcotics Anonymous meeting list.  Upon interview patient was found to be standing next to hospital bed, with jacket and backpack on.  Patient stated that he was "ready to get out of here."  When asked patient reported that he did have his Keppra medication and has been taking it as prescribed.  Patient denied any pain, discomfort, or injury.  Patient denied any suicidal or homicidal ideations.  Patient would not answer any additional questions and proceeded to leave the emergency department without waiting for his discharge paperwork.  Patient was encouraged to wait for discharge paperwork.          Past Medical History:  Diagnosis Date   Asthma    Seizures (HCC)    Stroke (HCC)     There are no problems to display for this patient.   Past Surgical History:  Procedure Laterality Date   APPENDECTOMY         No family history on file.  Social History   Tobacco Use   Smoking status: Current Every Day Smoker     Packs/day: 2.00    Years: 15.00    Pack years: 30.00    Types: Cigarettes   Smokeless tobacco: Current User  Substance Use Topics   Alcohol use: Not on file   Drug use: Not on file    Home Medications Prior to Admission medications   Medication Sig Start Date End Date Taking? Authorizing Provider  levETIRAcetam (KEPPRA) 500 MG tablet Take 1 tablet (500 mg total) by mouth 2 (two) times daily. 11/20/19   Garlon Hatchet, PA-C    Allergies    Patient has no allergy information on record.  Review of Systems   Review of Systems  Cardiovascular: Negative for chest pain.  Gastrointestinal: Negative for abdominal pain.  Musculoskeletal: Negative for back pain.  Skin: Negative for pallor.  Psychiatric/Behavioral: Negative for confusion and suicidal ideas.    Physical Exam Updated Vital Signs BP 125/80 (BP Location: Left Arm)    Pulse 68    Temp 98.7 F (37.1 C) (Oral)    Resp 17    Ht 5\' 11"  (1.803 m)    Wt 78 kg    SpO2 100%    BMI 23.99 kg/m   Physical Exam Constitutional:      General: He is not in acute distress.    Appearance: He is not ill-appearing, toxic-appearing or diaphoretic.  HENT:     Head: Normocephalic.  Eyes:     Comments: EOM grossly intact  Pulmonary:     Effort: Pulmonary effort is  normal.  Skin:    General: Skin is warm and dry.     Coloration: Skin is not pale.  Neurological:     Mental Status: He is alert and oriented to person, place, and time.  Psychiatric:        Thought Content: Thought content does not include homicidal or suicidal ideation. Thought content does not include homicidal or suicidal plan.     ED Results / Procedures / Treatments   Labs (all labs ordered are listed, but only abnormal results are displayed) Labs Reviewed  COMPREHENSIVE METABOLIC PANEL - Abnormal; Notable for the following components:      Result Value   Glucose, Bld 109 (*)    Total Bilirubin 1.4 (*)    All other components within normal limits    SALICYLATE LEVEL - Abnormal; Notable for the following components:   Salicylate Lvl <7.0 (*)    All other components within normal limits  ACETAMINOPHEN LEVEL - Abnormal; Notable for the following components:   Acetaminophen (Tylenol), Serum <10 (*)    All other components within normal limits  RAPID URINE DRUG SCREEN, HOSP PERFORMED - Abnormal; Notable for the following components:   Amphetamines POSITIVE (*)    Tetrahydrocannabinol POSITIVE (*)    All other components within normal limits  ETHANOL  CBC    EKG EKG Interpretation  Date/Time:  Thursday November 22 2019 15:09:14 EST Ventricular Rate:  105 PR Interval:  158 QRS Duration: 88 QT Interval:  356 QTC Calculation: 470 R Axis:   57 Text Interpretation: Sinus tachycardia with frequent Premature ventricular complexes No old tracing for comparison Abnormal ECG No STEMI Confirmed by Alona Bene [phone]) on 11/22/2019 10:34:38 PM   Radiology No results found.  Procedures Procedures (including critical care time)  Medications Ordered in ED Medications - No data to display  ED Course  I have reviewed the triage vital signs and the nursing notes.  Pertinent labs & imaging results that were available during my care of the patient were reviewed by me and considered in my medical decision making (see chart for details).    MDM Rules/Calculators/A&P                          Patient presented to emergency department for medical clearance to allow him to return to [facility].  Acetaminophen benefit level, salicylate level, ethanol, CMP, CBC, urine drug screen and EKG were ordered and interpreted.  CBC was within normal limits.  Patient's CMP showed glucose of 109 and total bilirubin of 1.4, with the all other components of the CMP within normal limits.  Ethanol was less than 10mg /dL.  Salicylate level was less than 7mg /dL.  Acetaminophen level was less than 10ug/mL.  Patient's rapid drug screen was positive for amphetamines and  cannabis positive.  EKG showed sinus tachycardia with frequent premature ventricular complexes and no STEMI.  Social worker spoke to the patient and [facility].  She reports that the [facility] will not accept the patient until he has a full neurological work-up in office with the neurologist with whom [facility] had arranged an appointment with for patient and cleared by that provider.  This information was then relayed to the patient.  Verbalizes understanding.  Narcotics Anonymous meeting list was given to the patient.  Patient denied any suicidal or homicidal ideations.  He does not appear to pose a threat to himself or others.  Patient was informed to return to emergency department if he  required medical attention or developed thoughts of hurting himself or others.  Patient left the emergency department before after visit summary and discharge instructions could be given to him.  Final Clinical Impression(s) / ED Diagnoses Final diagnoses:  Polysubstance abuse [facility])    Rx / DC Orders ED Discharge Orders    None       Berneice Heinrich 11/22/19 2238    Maia Plan, MD 11/23/19 1044

## 2019-11-22 NOTE — Progress Notes (Signed)
CSW met with Pt at bedside and explained that [facility] had stated that Pt already had an appointment set with a neurologist for less than 2 weeks from now.  Pt verbalized understanding. CSW offered Narcotics Anonymous meeting list, Pt accepted.  11/22/19 2024  TOC ED Mini Assessment  TOC Time spent with patient (minutes): 25  PING Used in TOC Assessment No  Admission or Readmission Diverted Yes  What brought you to the Emergency Department?  Seeking neurological consult  Barriers to Discharge No Barriers Identified  Means of departure Not know  Key Contact 1 [facility]   Spoke with Pam Drown Date 11/22/19  Contact time 2030  Contact Phone Number [phone]  Call outcome Due to Pt's extensive medical history, [facility] will not accept Pt as a return until he has had a full neurological workup, in office, with the neurologist with whom [facility] set the appointment.  Patient states their goals for this hospitalization and ongoing recovery are: Get back into detox

## 2019-11-22 NOTE — Discharge Instructions (Signed)
Get help right away if: You have thoughts about hurting yourself or others 

## 2019-11-27 ENCOUNTER — Encounter (HOSPITAL_COMMUNITY): Payer: Self-pay

## 2019-11-27 ENCOUNTER — Ambulatory Visit (HOSPITAL_COMMUNITY)
Admission: EM | Admit: 2019-11-27 | Discharge: 2019-11-28 | Disposition: A | Discharge status: Home / Self Care | Payer: No Payment, Other | Attending: Registered Nurse | Admitting: Registered Nurse

## 2019-11-27 ENCOUNTER — Other Ambulatory Visit: Payer: Self-pay

## 2019-11-27 DIAGNOSIS — F1920 Other psychoactive substance dependence, uncomplicated: Secondary | ICD-10-CM | POA: Diagnosis present

## 2019-11-27 DIAGNOSIS — R45851 Suicidal ideations: Secondary | ICD-10-CM | POA: Diagnosis not present

## 2019-11-27 DIAGNOSIS — Z5900 Homelessness unspecified: Secondary | ICD-10-CM | POA: Insufficient documentation

## 2019-11-27 DIAGNOSIS — F1994 Other psychoactive substance use, unspecified with psychoactive substance-induced mood disorder: Secondary | ICD-10-CM | POA: Diagnosis not present

## 2019-11-27 DIAGNOSIS — F419 Anxiety disorder, unspecified: Secondary | ICD-10-CM | POA: Insufficient documentation

## 2019-11-27 DIAGNOSIS — Z20822 Contact with and (suspected) exposure to covid-19: Secondary | ICD-10-CM | POA: Insufficient documentation

## 2019-11-27 DIAGNOSIS — F17210 Nicotine dependence, cigarettes, uncomplicated: Secondary | ICD-10-CM | POA: Insufficient documentation

## 2019-11-27 DIAGNOSIS — Z79899 Other long term (current) drug therapy: Secondary | ICD-10-CM | POA: Insufficient documentation

## 2019-11-27 DIAGNOSIS — R450 Nervousness: Secondary | ICD-10-CM | POA: Insufficient documentation

## 2019-11-27 DIAGNOSIS — F333 Major depressive disorder, recurrent, severe with psychotic symptoms: Secondary | ICD-10-CM | POA: Insufficient documentation

## 2019-11-27 LAB — POCT URINE DRUG SCREEN - MANUAL ENTRY (I-SCREEN)
POC Amphetamine UR: NOT DETECTED
POC Buprenorphine (BUP): NOT DETECTED
POC Cocaine UR: NOT DETECTED
POC Marijuana UR: POSITIVE — AB
POC Methadone UR: NOT DETECTED
POC Methamphetamine UR: NOT DETECTED
POC Morphine: NOT DETECTED
POC Oxazepam (BZO): NOT DETECTED
POC Oxycodone UR: NOT DETECTED
POC Secobarbital (BAR): NOT DETECTED

## 2019-11-27 LAB — CBC WITH DIFFERENTIAL/PLATELET
Abs Immature Granulocytes: 0.02 10*3/uL (ref 0.00–0.07)
Basophils Absolute: 0 10*3/uL (ref 0.0–0.1)
Basophils Relative: 0 %
Eosinophils Absolute: 0.2 10*3/uL (ref 0.0–0.5)
Eosinophils Relative: 5 %
HCT: 44.1 % (ref 39.0–52.0)
Hemoglobin: 14.5 g/dL (ref 13.0–17.0)
Immature Granulocytes: 0 %
Lymphocytes Relative: 30 %
Lymphs Abs: 1.6 10*3/uL (ref 0.7–4.0)
MCH: 31 pg (ref 26.0–34.0)
MCHC: 32.9 g/dL (ref 30.0–36.0)
MCV: 94.2 fL (ref 80.0–100.0)
Monocytes Absolute: 0.5 10*3/uL (ref 0.1–1.0)
Monocytes Relative: 10 %
Neutro Abs: 2.8 10*3/uL (ref 1.7–7.7)
Neutrophils Relative %: 55 %
Platelets: 305 10*3/uL (ref 150–400)
RBC: 4.68 MIL/uL (ref 4.22–5.81)
RDW: 12.1 % (ref 11.5–15.5)
WBC: 5.2 10*3/uL (ref 4.0–10.5)
nRBC: 0 % (ref 0.0–0.2)

## 2019-11-27 LAB — POC SARS CORONAVIRUS 2 AG -  ED: SARS Coronavirus 2 Ag: NEGATIVE

## 2019-11-27 LAB — LIPID PANEL
Cholesterol: 157 mg/dL (ref 0–200)
HDL: 57 mg/dL (ref >40)
LDL Cholesterol: 79 mg/dL (ref 0–99)
Total CHOL/HDL Ratio: 2.8 RATIO
Triglycerides: 106 mg/dL (ref <150)
VLDL: 21 mg/dL (ref 0–40)

## 2019-11-27 LAB — POC SARS CORONAVIRUS 2 AG: SARS Coronavirus 2 Ag: NEGATIVE

## 2019-11-27 LAB — COMPREHENSIVE METABOLIC PANEL
ALT: 20 U/L (ref 0–44)
AST: 19 U/L (ref 15–41)
Albumin: 3.8 g/dL (ref 3.5–5.0)
Alkaline Phosphatase: 92 U/L (ref 38–126)
Anion gap: 9 (ref 5–15)
BUN: 9 mg/dL (ref 6–20)
CO2: 29 mmol/L (ref 22–32)
Calcium: 8.9 mg/dL (ref 8.9–10.3)
Chloride: 102 mmol/L (ref 98–111)
Creatinine, Ser: 0.79 mg/dL (ref 0.61–1.24)
GFR, Estimated: >60 mL/min (ref >60)
Glucose, Bld: 97 mg/dL (ref 70–99)
Potassium: 3.4 mmol/L — ABNORMAL LOW (ref 3.5–5.1)
Sodium: 140 mmol/L (ref 135–145)
Total Bilirubin: 0.7 mg/dL (ref 0.3–1.2)
Total Protein: 6.8 g/dL (ref 6.5–8.1)

## 2019-11-27 LAB — RESPIRATORY PANEL BY RT PCR (FLU A&B, COVID)
Influenza A by PCR: NEGATIVE
Influenza B by PCR: NEGATIVE
SARS Coronavirus 2 by RT PCR: NEGATIVE

## 2019-11-27 LAB — TSH: TSH: 0.612 u[IU]/mL (ref 0.350–4.500)

## 2019-11-27 LAB — HEMOGLOBIN A1C
Hgb A1c MFr Bld: 5.4 % (ref 4.8–5.6)
Mean Plasma Glucose: 108.28 mg/dL

## 2019-11-27 LAB — ETHANOL: Alcohol, Ethyl (B): <10 mg/dL (ref <10)

## 2019-11-27 MED ORDER — TRAZODONE HCL 50 MG PO TABS
50.0000 mg | ORAL_TABLET | Freq: Every evening | ORAL | Status: DC | PRN
  Administered 2019-11-27: 50 mg via ORAL
  Filled 2019-11-27: qty 1
  Filled 2019-11-27: qty 7

## 2019-11-27 MED ORDER — HYDROXYZINE HCL 25 MG PO TABS: 25.0000 mg | ORAL_TABLET | Freq: Three times a day (TID) | ORAL | Status: DC | PRN

## 2019-11-27 MED ORDER — GABAPENTIN 300 MG PO CAPS
300.0000 mg | ORAL_CAPSULE | Freq: Three times a day (TID) | ORAL | Status: DC
  Administered 2019-11-27 – 2019-11-28 (×2): 300 mg via ORAL
  Filled 2019-11-27: qty 21
  Filled 2019-11-27 (×2): qty 1

## 2019-11-27 MED ORDER — OLANZAPINE 5 MG PO TBDP
5.0000 mg | ORAL_TABLET | Freq: Once | ORAL | Status: AC
  Administered 2019-11-27: 5 mg via ORAL
  Filled 2019-11-27: qty 1

## 2019-11-27 MED ORDER — NICOTINE 21 MG/24HR TD PT24
21.0000 mg | MEDICATED_PATCH | Freq: Once | TRANSDERMAL | Status: DC
  Administered 2019-11-27: 21 mg via TRANSDERMAL
  Filled 2019-11-27: qty 1

## 2019-11-27 NOTE — ED Notes (Signed)
Blood drawn pt left arm ac using 23g needle. Pt tolerated well. Pressure bandage applied. [facility] called for STAT lab pickup

## 2019-11-27 NOTE — ED Provider Notes (Signed)
Behavioral Health Admission H&P [facility] & [facility])  Date: 11/27/19 Patient Name: Fernando [name] MRN: [id number] Chief Complaint:  Chief Complaint  Patient presents with  . Suicidal    Plan to cut wrist or overdose on street medication   Chief Complaint/Presenting Problem: NA  Diagnoses:  Final diagnoses:  Polysubstance dependence including opioid type drug without complication, episodic abuse ([place])  Substance induced mood disorder ([place])  Passive suicidal ideations    HPI: Fernando [name], 44 y.o., male patient presents to [facility] as a walk in with complaints of suicidal ideation and polysubstance abuse.   Patient seen face to face by this provider, consulted with Dr. Dwyane Dee; and chart reviewed on 11/27/19.  On evaluation Fernando [name] reports he is having suicidal thoughts with a plan to slit his wrist or overdose on Xanax and Fentanyl.  Patient also states that he is having auditory and visual  hearing voices "guttural noises coming from demons, and seeing demon faces." Patient reports that his main stressor is the drug use and wanting to get clean.  Patient states that he feels he would be safe if he could get into a program.  Patient also states that he is homeless but his parents are supportive.  Patient states he was discharged from [facility] this morning but didn't do anything to help.  Discussed meeting with peer support to assist with rehab or halfway house and other community resources for substance use.   During evaluation Fernando [name] is alert/oriented x 4; calm/cooperative; and mood is congruent with affect.  He does not appear to be responding to internal/external stimuli or delusional thoughts.  Patient denies harm/homicidal ideation, psychosis, and paranoia; though he does endorse some passive suicidal thoughts with a plan but has no intentions of caring out.  Patient answered question appropriately.    PHQ 2-9:     Total Time spent with patient: 45 minutes  Musculoskeletal   Strength & Muscle Tone: within normal limits Gait & Station: normal Patient leans: N/A  Psychiatric Specialty Exam  Presentation General Appearance: Appropriate for Environment;Casual  Eye Contact:Good  Speech:Clear and Coherent;Normal Rate  Speech Volume:Normal  Handedness:Right   Mood and Affect  Mood:Depressed  Affect:Congruent;Depressed   Thought Process  Thought Processes:Coherent;Goal Directed  Descriptions of Associations:Intact  Orientation:Full (Time, Place and Person)  Thought Content:Logical;WDL  Hallucinations:Hallucinations: Auditory;Visual Description of Auditory Hallucinations: Patient reports he is hearing grutal noises like coming from a demon Description of Visual Hallucinations: Patient reports he is seeing the faces of demons.  Ideas of Reference:None  Suicidal Thoughts:Suicidal Thoughts: Yes, Passive SI Passive Intent and/or Plan: Without Intent;With Plan;With Means to Carry Out (Patient states that he really doesn't want to die just wants to get help with drugs.)  Homicidal Thoughts:Homicidal Thoughts: No   Sensorium  Memory:Immediate Good;Recent Good;Remote Good  Judgment:Intact  Insight:Fair;Present   Executive Functions  Concentration:Good  Attention Span:Good  [place]  Language:Good   Psychomotor Activity  Psychomotor Activity:Psychomotor Activity: Normal   Assets  Assets:Communication Skills;Desire for Improvement;Social Support   Sleep  Sleep:Sleep: Good   Physical Exam Vitals and nursing note reviewed.  Constitutional:      Appearance: Normal appearance.  HENT:     Head: Normocephalic and atraumatic.  Cardiovascular:     Rate and Rhythm: Normal rate and regular rhythm.  Musculoskeletal:        General: Normal range of motion.  Skin:    General: Skin is warm and dry.  Neurological:  General: No focal deficit present.     Mental Status: He is alert and oriented to  person, place, and time.  Psychiatric:        Attention and Perception: Attention normal. Auditory hallucinations: states he is hearing voices  Visual hallucinations: States he is seeing the faces of demons.        Mood and Affect: Affect normal. Mood is depressed.        Speech: Speech normal.        Behavior: Behavior normal. Behavior is cooperative.        Thought Content: Thought content is not paranoid or delusional. Thought content does not include homicidal ideation. Suicidal: Reporting passive but has a plan with no intent.    Review of Systems  Constitutional: Negative for chills and fever.  Cardiovascular: Negative for chest pain.  Psychiatric/Behavioral: Positive for hallucinations (Reporting auditory and visual hallucinations) and substance abuse (Polysubstance). Negative for memory loss. Depression: Stable. Suicidal ideas: Passive thoughts to overdose but states he doesn't want to kill himself.   The patient does not have insomnia. Nervous/anxious: Stable.        Reporting main stressor is substance abuse and wanting to get clean and get his life together.  States he feel he will be safe if he is able to get somewhere safe that help get his life together.  States he is willing to go to rehab or a halfway house.  States he has a picture ID, and is able to work.    All other systems reviewed and are negative.   Blood pressure (!) 134/100, pulse 86, temperature 98.2 F (36.8 C), temperature source Tympanic, SpO2 100 %. There is no height or weight on file to calculate BMI.  Past Psychiatric History: Reports history of schizoaffective disorder, polysubstance abuse,   Is the patient at risk to self? Yes  Has the patient been a risk to self in the past 6 months? No .    Has the patient been a risk to self within the distant past? No   Is the patient a risk to others? No   Has the patient been a risk to others in the past 6 months? No   Has the patient been a risk to others within  the distant past? No   Past Medical History:  Past Medical History:  Diagnosis Date  . Asthma   . Seizures ([place])   . Stroke [facility])     Past Surgical History:  Procedure Laterality Date  . APPENDECTOMY      Family History: History reviewed. No pertinent family history.  Social History:  Social History   Socioeconomic History  . Marital status: Single    Spouse name: Not on file  . Number of children: Not on file  . Years of education: Not on file  . Highest education level: Not on file  Occupational History  . Occupation: unemployed  Tobacco Use  . Smoking status: Current Every Day Smoker    Packs/day: 2.00    Years: 15.00    Pack years: 30.00    Types: Cigarettes  . Smokeless tobacco: Current User  Vaping Use  . Vaping Use: Former  Substance and Sexual Activity  . Alcohol use: Not Currently  . Drug use: Not Currently  . Sexual activity: Not on file  Other Topics Concern  . Not on file  Social History Narrative  . Not on file   Social Determinants of Health   Financial Resource Strain:   .  Difficulty of Paying Living Expenses: Not on file  Food Insecurity:   . Worried About Charity fundraiser in the Last Year: Not on file  . Ran Out of Food in the Last Year: Not on file  Transportation Needs:   . Lack of Transportation (Medical): Not on file  . Lack of Transportation (Non-Medical): Not on file  Physical Activity:   . Days of Exercise per Week: Not on file  . Minutes of Exercise per Session: Not on file  Stress:   . Feeling of Stress : Not on file  Social Connections:   . Frequency of Communication with Friends and Family: Not on file  . Frequency of Social Gatherings with Friends and Family: Not on file  . Attends Religious Services: Not on file  . Active Member of Clubs or Organizations: Not on file  . Attends Archivist Meetings: Not on file  . Marital Status: Not on file  Intimate Partner Violence:   . Fear of Current or Ex-Partner: Not  on file  . Emotionally Abused: Not on file  . Physically Abused: Not on file  . Sexually Abused: Not on file    SDOH:  SDOH Screenings   Alcohol Screen:   . Last Alcohol Screening Score (AUDIT): Not on file  Depression (PHQ2-9):   . PHQ-2 Score: Not on file  Financial Resource Strain:   . Difficulty of Paying Living Expenses: Not on file  Food Insecurity:   . Worried About Charity fundraiser in the Last Year: Not on file  . Ran Out of Food in the Last Year: Not on file  Housing:   . Last Housing Risk Score: Not on file  Physical Activity:   . Days of Exercise per Week: Not on file  . Minutes of Exercise per Session: Not on file  Social Connections:   . Frequency of Communication with Friends and Family: Not on file  . Frequency of Social Gatherings with Friends and Family: Not on file  . Attends Religious Services: Not on file  . Active Member of Clubs or Organizations: Not on file  . Attends Archivist Meetings: Not on file  . Marital Status: Not on file  Stress:   . Feeling of Stress : Not on file  Tobacco Use: High Risk  . Smoking Tobacco Use: Current Every Day Smoker  . Smokeless Tobacco Use: Current User  Transportation Needs:   . Lack of Transportation (Medical): Not on file  . Lack of Transportation (Non-Medical): Not on file    Last Labs:  Admission on 11/22/2019, Discharged on 11/22/2019  Component Date Value Ref Range Status  . Sodium 11/22/2019 139  135 - 145 mmol/L Final  . Potassium 11/22/2019 3.5  3.5 - 5.1 mmol/L Final  . Chloride 11/22/2019 105  98 - 111 mmol/L Final  . CO2 11/22/2019 25  22 - 32 mmol/L Final  . Glucose, Bld 11/22/2019 109* 70 - 99 mg/dL Final   Glucose reference range applies only to samples taken after fasting for at least 8 hours.  . BUN 11/22/2019 13  6 - 20 mg/dL Final  . Creatinine, Ser 11/22/2019 1.04  0.61 - 1.24 mg/dL Final  . Calcium 11/22/2019 9.0  8.9 - 10.3 mg/dL Final  . Total Protein 11/22/2019 7.0  6.5 -  8.1 g/dL Final  . Albumin 11/22/2019 3.8  3.5 - 5.0 g/dL Final  . AST 11/22/2019 27  15 - 41 U/L Final  . ALT 11/22/2019  23  0 - 44 U/L Final  . Alkaline Phosphatase 11/22/2019 87  38 - 126 U/L Final  . Total Bilirubin 11/22/2019 1.4* 0.3 - 1.2 mg/dL Final  . GFR, Estimated 11/22/2019 >60  >60 mL/min Final   Comment: (NOTE) Calculated using the CKD-EPI Creatinine Equation (2021)   . Anion gap 11/22/2019 9  5 - 15 Final   Performed at [facility], [place] [address], [place], [place] [address]  . Alcohol, Ethyl (B) 11/22/2019 <10  <10 mg/dL Final   Comment: (NOTE) Lowest detectable limit for serum alcohol is 10 mg/dL.  For medical purposes only. Performed at [facility], [place] [address], [place], [place] [address]   . Salicylate Lvl 73/71/0626 <7.0* 7.0 - 30.0 mg/dL Final   Performed at [place] [address], [place], [place] [address]  . Acetaminophen (Tylenol), Serum 11/22/2019 <10* 10 - 30 ug/mL Final   Comment: (NOTE) Therapeutic concentrations vary significantly. A range of 10-30 ug/mL  may be an effective concentration for many patients. However, some  are best treated at concentrations outside of this range. Acetaminophen concentrations >150 ug/mL at 4 hours after ingestion  and >50 ug/mL at 12 hours after ingestion are often associated with  toxic reactions.  Performed at [facility], [place] [address] [place], [place] [address]   . WBC 11/22/2019 5.3  4.0 - 10.5 K/uL Final  . RBC 11/22/2019 4.30  4.22 - 5.81 MIL/uL Final  . Hemoglobin 11/22/2019 13.5  13.0 - 17.0 g/dL Final  . HCT 11/22/2019 40.8  39 - 52 % Final  . MCV 11/22/2019 94.9  80.0 - 100.0 fL Final  . MCH 11/22/2019 31.4  26.0 - 34.0 pg Final  . MCHC 11/22/2019 33.1  30.0 - 36.0 g/dL Final  . RDW 11/22/2019 12.1  11.5 - 15.5 % Final  . Platelets 11/22/2019 295  150 - 400 K/uL Final  . nRBC 11/22/2019 0.0  0.0 - 0.2 % Final   Performed at [facility], [place] [address],  [place], [place] [address]  . Opiates 11/22/2019 NONE DETECTED  NONE DETECTED Final  . Cocaine 11/22/2019 NONE DETECTED  NONE DETECTED Final  . Benzodiazepines 11/22/2019 NONE DETECTED  NONE DETECTED Final  . Amphetamines 11/22/2019 POSITIVE* NONE DETECTED Final  . Tetrahydrocannabinol 11/22/2019 POSITIVE* NONE DETECTED Final  . Barbiturates 11/22/2019 NONE DETECTED  NONE DETECTED Final   Comment: (NOTE) DRUG SCREEN FOR MEDICAL PURPOSES ONLY.  IF CONFIRMATION IS NEEDED FOR ANY PURPOSE, NOTIFY LAB WITHIN 5 DAYS.  LOWEST DETECTABLE LIMITS FOR URINE DRUG SCREEN Drug Class                     Cutoff (ng/mL) Amphetamine and metabolites    1000 Barbiturate and metabolites    200 Benzodiazepine                 818 Tricyclics and metabolites     300 Opiates and metabolites        300 Cocaine and metabolites        300 THC                            50 Performed at [facility], [place] [address] [place],  [address]   Admission on 11/19/2019, Discharged on 11/20/2019  Component Date Value Ref Range Status  . Sodium 11/19/2019 138  135 - 145 mmol/L Final  .  Potassium 11/19/2019 3.6  3.5 - 5.1 mmol/L Final  . Chloride 11/19/2019 105  98 - 111 mmol/L Final  . CO2 11/19/2019 25  22 - 32 mmol/L Final  . Glucose, Bld 11/19/2019 120* 70 - 99 mg/dL Final   Glucose reference range applies only to samples taken after fasting for at least 8 hours.  . BUN 11/19/2019 11  6 - 20 mg/dL Final  . Creatinine, Ser 11/19/2019 0.81  0.61 - 1.24 mg/dL Final  . Calcium 11/19/2019 8.8* 8.9 - 10.3 mg/dL Final  . Total Protein 11/19/2019 6.8  6.5 - 8.1 g/dL Final  . Albumin 11/19/2019 3.6  3.5 - 5.0 g/dL Final  . AST 11/19/2019 25  15 - 41 U/L Final  . ALT 11/19/2019 23  0 - 44 U/L Final  . Alkaline Phosphatase 11/19/2019 70  38 - 126 U/L Final  . Total Bilirubin 11/19/2019 1.1  0.3 - 1.2 mg/dL Final  . GFR, Estimated 11/19/2019 >60  >60 mL/min Final   Comment: (NOTE) Calculated using the CKD-EPI  Creatinine Equation (2021)   . Anion gap 11/19/2019 8  5 - 15 Final   Performed at [facility], [place] [address], [place], [place] [address]  . Alcohol, Ethyl (B) 11/19/2019 <10  <10 mg/dL Final   Comment: (NOTE) Lowest detectable limit for serum alcohol is 10 mg/dL.  For medical purposes only. Performed at [facility], [place] [address], [place], [place] [address]   . WBC 11/19/2019 4.5  4.0 - 10.5 K/uL Final  . RBC 11/19/2019 4.69  4.22 - 5.81 MIL/uL Final  . Hemoglobin 11/19/2019 14.6  13.0 - 17.0 g/dL Final  . HCT 11/19/2019 44.3  39 - 52 % Final  . MCV 11/19/2019 94.5  80.0 - 100.0 fL Final  . MCH 11/19/2019 31.1  26.0 - 34.0 pg Final  . MCHC 11/19/2019 33.0  30.0 - 36.0 g/dL Final  . RDW 11/19/2019 12.2  11.5 - 15.5 % Final  . Platelets 11/19/2019 280  150 - 400 K/uL Final  . nRBC 11/19/2019 0.0  0.0 - 0.2 % Final   Performed at [facility], [place] [address], [place], [place] [address]  . Opiates 11/19/2019 NONE DETECTED  NONE DETECTED Final  . Cocaine 11/19/2019 NONE DETECTED  NONE DETECTED Final  . Benzodiazepines 11/19/2019 NONE DETECTED  NONE DETECTED Final  . Amphetamines 11/19/2019 POSITIVE* NONE DETECTED Final  . Tetrahydrocannabinol 11/19/2019 POSITIVE* NONE DETECTED Final  . Barbiturates 11/19/2019 NONE DETECTED  NONE DETECTED Final   Comment: (NOTE) DRUG SCREEN FOR MEDICAL PURPOSES ONLY.  IF CONFIRMATION IS NEEDED FOR ANY PURPOSE, NOTIFY LAB WITHIN 5 DAYS.  LOWEST DETECTABLE LIMITS FOR URINE DRUG SCREEN Drug Class                     Cutoff (ng/mL) Amphetamine and metabolites    1000 Barbiturate and metabolites    200 Benzodiazepine                 696 Tricyclics and metabolites     300 Opiates and metabolites        300 Cocaine and metabolites        300 THC                            50 Performed at [facility], [place] [address] [place], [place] [address]   . Acetaminophen (Tylenol), Serum 11/19/2019 <  10* 10 - 30 ug/mL Final    Comment: (NOTE) Therapeutic concentrations vary significantly. A range of 10-30 ug/mL  may be an effective concentration for many patients. However, some  are best treated at concentrations outside of this range. Acetaminophen concentrations >150 ug/mL at 4 hours after ingestion  and >50 ug/mL at 12 hours after ingestion are often associated with  toxic reactions.  Performed at [facility], [place] [address] [place], [place] [address]   . Salicylate Lvl 50/09/3816 <7.0* 7.0 - 30.0 mg/dL Final   Performed at [place] [address] [place], [place] [address]    Allergies: Patient has no allergy information on record.  PTA Medications: (Not in a hospital admission)   Medical Decision Making  Admitted to continuous assessment unit Routine Labs/EKG ordered  Lab Orders     Resp Panel by RT-PCR (Flu A&B, Covid) Nasopharyngeal Swab     CBC with Differential/Platelet     Comprehensive metabolic panel     Ethanol     TSH     Lipid panel     Urinalysis, Routine w reflex microscopic Urine, Clean Catch     Hemoglobin A1c     POC SARS Coronavirus 2 Ag-ED - Nasal Swab (BD Veritor Kit)     POCT Urine Drug Screen - ([facility])   Medication Management: Started:    Zyprexa 5 mg Q hs for psychosis Trazodone 50 mg Q hs Prn sleep Vistaril 25 mg Tid Prn Gabapentin 300 mg Tid for withdrawal  Peer Support: ordered to assist with resources for substance use community services rehab/halfway house resources  Recommendations  Based on my evaluation the patient does not appear to have an emergency medical condition.  Fernando Volkert, NP 11/27/19  6:20 PM

## 2019-11-27 NOTE — ED Notes (Signed)
LOCKER #[id number] 

## 2019-11-27 NOTE — ED Notes (Signed)
Pt sleeping at present, no distress noted, monitoring for safety. 

## 2019-11-27 NOTE — ED Notes (Signed)
Pt reports feeling very tired & wanting night time meds now so he can sleep. Meds per MAR.  Given additional blanket as well and patient lay down. Denies SI/HI/AVH at this time.

## 2019-11-27 NOTE — Discharge Instructions (Addendum)

## 2019-11-27 NOTE — Progress Notes (Signed)
CSW provided patient with a resource guide to Substance Abuse Treatment facilities located in the [place].   Ladoris Gene MSW,LCSWA,LCASA Clinical Social Worker  [place] Disposition [phone] (cell)

## 2019-11-27 NOTE — ED Notes (Signed)
Meal given: Ham sandwich, chips, pudding, and cola

## 2019-11-27 NOTE — ED Triage Notes (Addendum)
Received Fernando [name] at the [facility] with his mother and step dad. His chief compliant is suicidal related to his substance abuse. H is plan is to cut his wrist or OD on his medications. He stated he has been clean with from Alcohol and drugs for 2 weeks. He has a past hx of CVA in 2019.

## 2019-11-27 NOTE — ED Notes (Signed)
Pt A&O x 4, no distress noted, calm & cooperative, watching TV at present.  Monitoring for safety. 

## 2019-11-27 NOTE — BH Assessment (Signed)
Comprehensive Clinical Assessment (CCA) Note  11/27/2019 Fernando [name] [id number]   Patient is a 44 year old male [facility] to [facility] for assessment of suicidal ideation and substance use. Patient reports current SI with a plan to cut his wrists or overdose. Patient [facility] earlier this date with similar presentation. Patient currently homeless. Patient reports he has been clean from heroin and meth for 5 days. He reports using 1-2 grams of heroin daily and 1-2 grams of meth daily. He went to detox at [facility] in [place] last week, however, had to leave due to seizures. Patient denies HI. He reports AH of demonic voices and VH of demons. Patient states he was previously diagnosed with schizophrenia and bipolar disorder at [facility] 8 months ago but has not been on any prescribed medications. Patient denies any trauma history or criminal charges. At this time he cannot contract for safety, stating he would hurt himself if he were to leave here. Patient states he would ultimately like to go to substance use treatment and can keep himself safe if he goes there.  Shuvon Rankin, NP recommends patient be admitted to continuous assessment for observation.  Chief Complaint:  Chief Complaint  Patient presents with  . Suicidal    Plan to cut wrist or overdose on street medication   Visit Diagnosis: F33.3 MDD, recurrent, with psychosis    F11.20 Opioid use disorder, severe    F15.20 Amphetamine use disorder, severe      CCA Screening, Triage and Referral (STR)  Patient Reported Information How did you hear about us? Family/Friend  Referral name: No data recorded Referral phone number: No data recorded  Whom do you see for routine medical problems? Hospital ER  Practice/Facility Name: No data recorded Practice/Facility Phone Number: No data recorded Name of Contact: No data recorded Contact Number: No data recorded Contact Fax Number: No data recorded Prescriber Name: No data  recorded Prescriber Address (if known): No data recorded  What Is the Reason for Your Visit/Call Today? Trying to get clean and suicide  How Long Has This Been Causing You Problems? > than 6 months  What Do You Feel Would Help You the Most Today? Other (Comment) (some kind of long term help)   Have You Recently Been in Any Inpatient Treatment (Hospital/Detox/Crisis Center/28-Day Program)? Yes  Name/Location of Program/Hospital:[facility] [place], [facility], [facility]  How Long Were You There? UTA  When Were You Discharged? No data recorded  Have You Ever Re[facility] Before? Yes  Who [facility] You [facility]? ER   Have You Recently Had Any Thoughts About Hurting Yourself? Yes  Are You Planning to Commit Suicide/Harm Yourself At This time? Yes   Have you Recently Had Thoughts About Hurting Someone Karolee Ohslse? Yes  Explanation: No data recorded  Have You Used Any Alcohol or Drugs in the Past 24 Hours? No  How Long Ago Did You Use Drugs or Alcohol? No data recorded What Did You Use and How Much? No data recorded  Do You Currently Have a Therapist/Psychiatrist? No  Name of Therapist/Psychiatrist: No data recorded  Have You Been Recently Discharged From Any Office Practice or Programs? No  Explanation of Discharge From Practice/Program: No data recorded    CCA Screening Triage Referral Assessment Type of Contact: Face-to-Face  Is this Initial or Reassessment? No data recorded Date Telepsych consult ordered in [facility]:  No data recorded Time Telepsych consult ordered in [facility]:  No data recorded  Patient  Reported Information Reviewed? Yes  Patient Left Without Being Seen? No data recorded Reason for Not Completing Assessment: No data recorded  Collateral Involvement: mother- Lupita Leash   Does Patient Have a Automotive engineer Guardian? No data recorded Name and Contact of Legal Guardian: No data recorded If Minor and Not Living with Parent(s),  Who has Custody? No data recorded Is CPS involved or ever been involved? Never  Is APS involved or ever been involved? Never   Patient Determined To Be At Risk for Harm To Self or Others Based on Review of Patient Reported Information or Presenting Complaint? No  Method: No data recorded Availability of Means: No data recorded Intent: No data recorded Notification Required: No data recorded Additional Information for Danger to Others Potential: No data recorded Additional Comments for Danger to Others Potential: No data recorded Are There Guns or Other Weapons in Your Home? No data recorded Types of Guns/Weapons: No data recorded Are These Weapons Safely Secured?                            No data recorded Who Could Verify You Are Able To Have These Secured: No data recorded Do You Have any Outstanding Charges, Pending Court Dates, Parole/Probation? No data recorded Contacted To Inform of Risk of Harm To Self or Others: No data recorded  Location of Assessment: [facility]   Does Patient Present under Involuntary Commitment? No  IVC Papers Initial File Date: No data recorded  [place] of Residence: [place]   Patient Currently Receiving the Following Services: Not Receiving Services   Determination of Need: Urgent (48 hours)   Options For Referral: [facility]     CCA Biopsychosocial Intake/Chief Complaint:  NA  Current Symptoms/Problems: NA   Patient Reported Schizophrenia/Schizoaffective Diagnosis in Past: Yes   Strengths: NA  Preferences: NA  Abilities: NA   Type of Services Patient Feels are Needed: NA   Initial Clinical Notes/Concerns: NA   Mental Health Symptoms Depression:  Change in energy/activity;Difficulty Concentrating;Fatigue;Hopelessness;Increase/decrease in appetite;Irritability;Sleep (too much or little);Tearfulness;Weight gain/loss;Worthlessness   Duration of Depressive symptoms: Greater than two weeks   Mania:   None   Anxiety:   None   Psychosis:  Hallucinations   Duration of Psychotic symptoms: Greater than six months   Trauma:  None   Obsessions:  None   Compulsions:  None   Inattention:  None   Hyperactivity/Impulsivity:  N/A   Oppositional/Defiant Behaviors:  N/A   Emotional Irregularity:  N/A   Other Mood/Personality Symptoms:  No data recorded   Mental Status Exam Appearance and self-care  Stature:  Average   Weight:  Average weight   Clothing:  Disheveled;Dirty   Grooming:  Neglected   Cosmetic use:  None   Posture/gait:  Normal   Motor activity:  Not Remarkable   Sensorium  Attention:  Normal   Concentration:  Normal   Orientation:  X5   Recall/memory:  Normal   Affect and Mood  Affect:  Flat   Mood:  Depressed   Relating  Eye contact:  Normal   Facial expression:  Responsive   Attitude toward examiner:  Cooperative   Thought and Language  Speech flow: Clear and Coherent   Thought content:  Appropriate to Mood and Circumstances   Preoccupation:  None   Hallucinations:  Auditory   Organization:  No data recorded  [employer] of Knowledge:  Fair   Intelligence:  Average  Abstraction:  Normal   Judgement:  Impaired   Reality Testing:  Realistic   Insight:  Fair   Decision Making:  Impulsive   Social Functioning  Social Maturity:  Irresponsible   Social Judgement:  Normal   Stress  Stressors:  Housing;Illness;Financial   Coping Ability:  Deficient supports   Skill Deficits:  Decision making   Supports:  Family     Religion: Religion/Spirituality Are You A Religious Person?: No  Leisure/Recreation: Leisure / Recreation Do You Have Hobbies?: No  Exercise/Diet: Exercise/Diet Do You Exercise?: No Have You Gained or Lost A Significant Amount of Weight in the Past Six Months?: No Do You Follow a Special Diet?: No Do You Have Any Trouble Sleeping?: Yes Explanation of Sleeping Difficulties: reports  3-4 hours of sleep per night   CCA Employment/Education Employment/Work Situation: Employment / Work Situation Employment situation: Unemployed Patient's job has been impacted by current illness: No What is the longest time patient has a held a job?: UTA Where was the patient employed at that time?: 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 Has patient ever been in the [employer]?: No  Education: Education Is Patient Currently Attending School?: No Last Grade Completed: 12 Did Garment/textile technologist From [employer]?: Yes Did Theme park manager?: No Did Designer, television/film set?: No Did You Have An Individualized Education Program (IIEP): No Did You Have Any Difficulty At [employer]?: No Patient's Education Has Been Impacted by Current Illness: No   CCA Family/Childhood History Family and Relationship History: Family history Marital status: Single Are you sexually active?: No What is your sexual orientation?: heterosexaul Has your sexual activity been affected by drugs, alcohol, medication, or emotional stress?: NA Does patient have children?: Yes How many children?: 3 How is patient's relationship with their children?: living with patient's mother  Childhood History:  Childhood History By whom was/is the patient raised?: Both parents Additional childhood history information: none Description of patient's relationship with caregiver when they were a child: supportive Patient's description of current relationship with people who raised him/her: supportive, brought him in today How were you disciplined when you got in trouble as a child/adolescent?: NA Does patient have siblings?: No Did patient suffer any verbal/emotional/physical/sexual abuse as a child?: No Did patient suffer from severe childhood neglect?: No Has patient ever been sexually abused/assaulted/raped as an adolescent or adult?: No Was the patient ever a victim of a crime or a disaster?:  No Witnessed domestic violence?: No Has patient been affected by domestic violence as an adult?: No  Child/Adolescent Assessment:     CCA Substance Use Alcohol/Drug Use: Alcohol / Drug Use Pain Medications: see MAR Prescriptions: see MAR Over the Counter: see MAR History of alcohol / drug use?: Yes Longest period of sobriety (when/how long): currently 5 days clean Substance #1 Name of Substance 1: heroin 1 - Age of First Use: 20 1 - Amount (size/oz): 1-2 grams 1 - Frequency: daily 1 - Duration: 25 years 1 - Last Use / Amount: 11/18 Substance #2 Name of Substance 2: meth 2 - Age of First Use: 39 2 - Amount (size/oz): 1-2 grams 2 - Frequency: daily 2 - Duration: 5 years 2 - Last Use / Amount: 11/18                     ASAM's:  Six Dimensions of Multidimensional Assessment  Dimension 1:  Acute Intoxication and/or Withdrawal Potential:  Dimension 2:  Biomedical Conditions and Complications:      Dimension 3:  Emotional, Behavioral, or Cognitive Conditions and Complications:     Dimension 4:  Readiness to Change:     Dimension 5:  Relapse, Continued use, or Continued Problem Potential:     Dimension 6:  Recovery/Living Environment:     ASAM Severity Score:    ASAM Recommended Level of Treatment: ASAM Recommended Level of Treatment: Level III Residential Treatment   Substance use Disorder (SUD) Substance Use Disorder (SUD)  Checklist Symptoms of Substance Use: Continued use despite having a persistent/recurrent physical/psychological problem caused/exacerbated by use, Continued use despite persistent or recurrent social, interpersonal problems, caused or exacerbated by use, Evidence of tolerance, Evidence of withdrawal (Comment), Large amounts of time spent to obtain, use or recover from the substance(s), Persistent desire or unsuccessful efforts to cut down or control use, Presence of craving or strong urge to use, Recurrent use that results in a failure to  fulfill major role obligations (work, school, home), Repeated use in physically hazardous situations, Social, occupational, recreational activities given up or reduced due to use, Substance(s) often taken in larger amounts or over longer times than was intended  Recommendations for Services/Supports/Treatments: Recommendations for Services/Supports/Treatments Recommendations For Services/Supports/Treatments: IOP (Intensive Outpatient Program), Medication Management, Peer Support Services  DSM5 Diagnoses: Patient Active Problem List   Diagnosis Date Noted  . Polysubstance dependence including opioid type drug without complication, episodic abuse (HCC) 11/27/2019  . Substance induced mood disorder (HCC) 11/27/2019    Patient Centered Plan: Patient is on the following Treatment Plan(s):     Referrals to Alternative Service(s): Referred to Alternative Service(s):   Place:   Date:   Time:    Referred to Alternative Service(s):   Place:   Date:   Time:    Referred to Alternative Service(s):   Place:   Date:   Time:    Referred to Alternative Service(s):   Place:   Date:   Time:     Celedonio Miyamoto, LCSW

## 2019-11-28 MED ORDER — LEVETIRACETAM 500 MG PO TABS
500.0000 mg | ORAL_TABLET | Freq: Two times a day (BID) | ORAL | Status: DC
  Filled 2019-11-28: qty 14

## 2019-11-28 MED ORDER — OLANZAPINE 5 MG PO TABS: 5.0000 mg | ORAL_TABLET | Freq: Every day | ORAL | 0 refills | Status: DC

## 2019-11-28 MED ORDER — TRAZODONE HCL 50 MG PO TABS: 50.0000 mg | ORAL_TABLET | Freq: Every evening | ORAL | 0 refills | Status: DC | PRN

## 2019-11-28 MED ORDER — OLANZAPINE 5 MG PO TABS
5.0000 mg | ORAL_TABLET | Freq: Every day | ORAL | Status: DC
  Filled 2019-11-28: qty 7

## 2019-11-28 MED ORDER — GABAPENTIN 300 MG PO CAPS: 300.0000 mg | ORAL_CAPSULE | Freq: Three times a day (TID) | ORAL | 0 refills | Status: DC

## 2019-11-28 NOTE — ED Notes (Signed)
Pt sleeping at present, no distress noted, calm & cooperative.  Monitoring for safety. 

## 2019-11-28 NOTE — ED Notes (Signed)
Patient called mom to pick him up.

## 2019-11-28 NOTE — ED Notes (Signed)
Patient resting on bed with eyes opened. Patient informed of medications to be given to him this morning. Patient denies SI/HI. Patient provided support and encouragement. Monitoring continues.

## 2019-11-28 NOTE — ED Notes (Signed)
Pt given breakfast; muffin, oatmeal.

## 2019-11-28 NOTE — ED Notes (Signed)
Nurse Discharge Note:  D:Patient denies SI/HI/AVH at this time. Pt appears calm and cooperative, and no distress noted.  A: All Personal items in locker returned to pt. Pt given AVS/ Follow-Up/Prescriptions. Patient verbalized understanding. Copy AVS/ prescriptions reviewed with patient and prescription samples to take home given to patient. Patient escorted to lobby to wait for mom.  Patient states he will comply with out patient services and take medications as prescribed.

## 2019-11-28 NOTE — ED Provider Notes (Signed)
FBC/OBS ASAP Discharge Summary  Date and Time: 11/28/2019 10:29 AM  Name: Fernando [name]  MRN:  [id number]   Discharge Diagnoses:  Final diagnoses:  Polysubstance dependence including opioid type drug without complication, episodic abuse (HCC)  Substance induced mood disorder (HCC)  Passive suicidal ideations    Subjective: Patient reports this morning that he is doing great.  He denies any suicidal or homicidal ideations and denies any hallucinations.  Patient continues to report that he wants to go to residential rehab facility.  He states that he used heroin approximately 2 weeks ago and methamphetamines approximately 5 days ago.  He is currently only showing positive for marijuana.  Patient is informed that he may not be accepted to these places and he stated understanding and agreement.  He then states that he will agree to go to CD IOP with the [facility] if this does not work.  Stay Summary: Patient is a 44 year old male who presented to the [facility] voluntarily as a walk-in reporting passive suicidal ideations reporting substance abuse of heroin, methamphetamines, marijuana.  Patient is homeless and is stating that he is wanting rehab.  He did report having auditory and visual hallucinations as well.  Patient was admitted to the continuous observation unit and started on medications.  Today the patient is reporting that he is feeling better and that he slept well last night.  He denies any suicidal or homicidal ideations and denies any hallucinations.  He has continued to request to go to residential treatment for substance abuse.  [facility] and he was denied at both.  Patient was informed of this and he continued to request the CD IOP at the [facility].  Social work schedule patient appointment for CD IOP in December.  Patient was informed that he would be discharging and he was requesting prescriptions for his medications.  Medications were printed and  prescribed for Neurontin 300 mg p.o. 3 times daily, Zyprexa 5 mg p.o. nightly.  Patient stated that he had his mother that would come and pick him up and he was going to contact her.  Total Time spent with patient: 30 minutes  Past Psychiatric History: Polysubstance abuse, suicidal ideations Past Medical History:  Past Medical History:  Diagnosis Date  . Asthma   . Seizures (HCC)   . Stroke [facility])     Past Surgical History:  Procedure Laterality Date  . APPENDECTOMY     Family History: History reviewed. No pertinent family history. Family Psychiatric History: None reported Social History:  Social History   Substance and Sexual Activity  Alcohol Use Not Currently     Social History   Substance and Sexual Activity  Drug Use Not Currently    Social History   Socioeconomic History  . Marital status: Single    Spouse name: Not on file  . Number of children: Not on file  . Years of education: Not on file  . Highest education level: Not on file  Occupational History  . Occupation: unemployed  Tobacco Use  . Smoking status: Current Every Day Smoker    Packs/day: 2.00    Years: 15.00    Pack years: 30.00    Types: Cigarettes  . Smokeless tobacco: Current User  Vaping Use  . Vaping Use: Former  Substance and Sexual Activity  . Alcohol use: Not Currently  . Drug use: Not Currently  . Sexual activity: Not on file  Other Topics Concern  .  Not on file  Social History Narrative  . Not on file   Social Determinants of Health   Financial Resource Strain:   . Difficulty of Paying Living Expenses: Not on file  Food Insecurity:   . Worried About Programme researcher, broadcasting/film/video in the Last Year: Not on file  . Ran Out of Food in the Last Year: Not on file  Transportation Needs:   . Lack of Transportation (Medical): Not on file  . Lack of Transportation (Non-Medical): Not on file  Physical Activity:   . Days of Exercise per Week: Not on file  . Minutes of Exercise per Session: Not  on file  Stress:   . Feeling of Stress : Not on file  Social Connections:   . Frequency of Communication with Friends and Family: Not on file  . Frequency of Social Gatherings with Friends and Family: Not on file  . Attends Religious Services: Not on file  . Active Member of Clubs or Organizations: Not on file  . Attends Banker Meetings: Not on file  . Marital Status: Not on file   SDOH:  SDOH Screenings   Alcohol Screen:   . Last Alcohol Screening Score (AUDIT): Not on file  Depression (PHQ2-9):   . PHQ-2 Score: Not on file  Financial Resource Strain:   . Difficulty of Paying Living Expenses: Not on file  Food Insecurity:   . Worried About Programme researcher, broadcasting/film/video in the Last Year: Not on file  . Ran Out of Food in the Last Year: Not on file  Housing:   . Last Housing Risk Score: Not on file  Physical Activity:   . Days of Exercise per Week: Not on file  . Minutes of Exercise per Session: Not on file  Social Connections:   . Frequency of Communication with Friends and Family: Not on file  . Frequency of Social Gatherings with Friends and Family: Not on file  . Attends Religious Services: Not on file  . Active Member of Clubs or Organizations: Not on file  . Attends Banker Meetings: Not on file  . Marital Status: Not on file  Stress:   . Feeling of Stress : Not on file  Tobacco Use: High Risk  . Smoking Tobacco Use: Current Every Day Smoker  . Smokeless Tobacco Use: Current User  Transportation Needs:   . Lack of Transportation (Medical): Not on file  . Lack of Transportation (Non-Medical): Not on file    Has this patient used any form of tobacco in the last 30 days? (Cigarettes, Smokeless Tobacco, Cigars, and/or Pipes) A prescription for an FDA-approved tobacco cessation medication was offered at discharge and the patient refused  Current Medications:  Current Facility-Administered Medications  Medication Dose Route Frequency Provider Last Rate  Last Admin  . gabapentin (NEURONTIN) capsule 300 mg  300 mg Oral TID Rankin, Shuvon B, NP   300 mg at 11/28/19 0856  . hydrOXYzine (ATARAX/VISTARIL) tablet 25 mg  25 mg Oral TID PRN Rankin, Shuvon B, NP      . nicotine (NICODERM CQ - dosed in mg/24 hours) patch 21 mg  21 mg Transdermal Once Rankin, Shuvon B, NP   21 mg at 11/27/19 1948  . traZODone (DESYREL) tablet 50 mg  50 mg Oral QHS PRN Rankin, Shuvon B, NP   50 mg at 11/27/19 1947   Current Outpatient Medications  Medication Sig Dispense Refill  . aspirin 325 MG tablet Take 325 mg by mouth daily.  PHX - CVA    . gabapentin (NEURONTIN) 300 MG capsule Take 300 mg by mouth 2 (two) times daily. Nerve Damage    . levETIRAcetam (KEPPRA) 500 MG tablet Take 1 tablet (500 mg total) by mouth 2 (two) times daily. 60 tablet 0    PTA Medications: (Not in a hospital admission)   Musculoskeletal  Strength & Muscle Tone: within normal limits Gait & Station: normal Patient leans: N/A  Psychiatric Specialty Exam  Presentation  General Appearance: Appropriate for Environment;Casual  Eye Contact:Good  Speech:Clear and Coherent;Normal Rate  Speech Volume:Normal  Handedness:Right   Mood and Affect  Mood:Euthymic  Affect:Appropriate;Congruent   Thought Process  Thought Processes:Coherent  Descriptions of Associations:Intact  Orientation:Full (Time, Place and Person)  Thought Content:WDL  Hallucinations:Hallucinations: None Description of Auditory Hallucinations: Reports he is hearing guttural sound from demon Description of Visual Hallucinations: Patient reports he is seeing the faces of demons.  Ideas of Reference:None  Suicidal Thoughts:Suicidal Thoughts: No SI Passive Intent and/or Plan: Without Intent;With Plan;With Means to Carry Out (Patient states that he really doesn't want to die just wants to get help with drugs.)  Homicidal Thoughts:Homicidal Thoughts: No   Sensorium  Memory:Immediate Good;Recent Good;Remote  Good  Judgment:Intact  Insight:Good   Executive Functions  Concentration:Good  Attention Span:Good  Recall:Good  Fund of Knowledge:Good  Language:Good   Psychomotor Activity  Psychomotor Activity:Psychomotor Activity: Normal   Assets  Assets:Communication Skills;Desire for Improvement;Resilience   Sleep  Sleep:Sleep: Good   Physical Exam  Physical Exam Vitals and nursing note reviewed.  Constitutional:      Appearance: He is well-developed.  HENT:     Head: Normocephalic.  Eyes:     Pupils: Pupils are equal, round, and reactive to light.  Cardiovascular:     Rate and Rhythm: Normal rate.  Pulmonary:     Effort: Pulmonary effort is normal.  Musculoskeletal:        General: Normal range of motion.  Neurological:     Mental Status: He is alert and oriented to person, place, and time.    Review of Systems  Constitutional: Negative.   HENT: Negative.   Eyes: Negative.   Respiratory: Negative.   Cardiovascular: Negative.   Gastrointestinal: Negative.   Genitourinary: Negative.   Musculoskeletal: Negative.   Skin: Negative.   Neurological: Negative.   Endo/Heme/Allergies: Negative.   Psychiatric/Behavioral: Positive for substance abuse.   Blood pressure 101/69, pulse 90, temperature 98.6 F (37 C), temperature source Oral, resp. rate 18, SpO2 99 %. There is no height or weight on file to calculate BMI.  Demographic Factors:  Male, Caucasian and Low socioeconomic status  Loss Factors: NA  Historical Factors: NA  Risk Reduction Factors:   Sense of responsibility to family, Living with another person, especially a relative, Positive social support and Positive therapeutic relationship  Continued Clinical Symptoms:  Alcohol/Substance Abuse/Dependencies  Cognitive Features That Contribute To Risk:  None    Suicide Risk:  Minimal: No identifiable suicidal ideation.  Patients presenting with no risk factors but with morbid ruminations; may be  classified as minimal risk based on the severity of the depressive symptoms  Plan Of Care/Follow-up recommendations:  Continue activity as tolerated. Continue diet as recommended by your PCP. Ensure to keep all appointments with outpatient providers.  Disposition: Discharge home with mother  Maryfrances Bunnell, FNP 11/28/2019, 10:29 AM

## 2019-12-03 LAB — POCT URINALYSIS DIP (DEVICE)
Bilirubin Urine: NEGATIVE
Glucose, UA: NEGATIVE mg/dL
Hgb urine dipstick: NEGATIVE
Ketones, ur: NEGATIVE mg/dL
Leukocytes,Ua: NEGATIVE
Nitrite: NEGATIVE
Protein, ur: NEGATIVE mg/dL
Specific Gravity, Urine: 1.02 (ref 1.005–1.030)
Urobilinogen, UA: 1 mg/dL (ref 0.0–1.0)
pH: 5.5 (ref 5.0–8.0)

## 2019-12-04 ENCOUNTER — Telehealth (HOSPITAL_COMMUNITY): Payer: Self-pay

## 2019-12-04 NOTE — Telephone Encounter (Signed)
Per documentation in epic.  Patient has a follow up appt with the [facility] program (Substance Abuse Intensive Outpatient Program)  on 12-19-2019

## 2019-12-04 NOTE — Telephone Encounter (Signed)
Care Management - Follow Up [facility] Discharges   Patient mother answered the phone and states that this phone number belongs to her and that the patient (her son) does not have a phone.  His mother went on to say that the patient (her son) is homeless.    Writer left name and phone number for the patent to call back if further assistance is needed in scheduling a follow up appointment with an outpatient provider.

## 2019-12-19 ENCOUNTER — Ambulatory Visit (HOSPITAL_COMMUNITY): Payer: No Payment, Other | Admitting: Behavioral Health

## 2020-01-19 ENCOUNTER — Inpatient Hospital Stay
Admit: 2020-01-19 | Discharge: 2020-01-19 | Disposition: A | Discharge status: Home / Self Care | Attending: Emergency Medicine

## 2020-01-19 DIAGNOSIS — F1990 Other psychoactive substance use, unspecified, uncomplicated: Secondary | ICD-10-CM

## 2020-01-19 LAB — COMPREHENSIVE METABOLIC PANEL
ALT: 25 U/L (ref 12–65)
AST: 14 U/L — ABNORMAL LOW (ref 15–37)
Albumin/Globulin Ratio: 0.9 — ABNORMAL LOW (ref 1.2–3.5)
Albumin: 3.5 g/dL (ref 3.5–5.0)
Alkaline Phosphatase: 122 U/L (ref 50–136)
Anion Gap: 4 mmol/L — ABNORMAL LOW (ref 7–16)
BUN: 12 MG/DL (ref 6–23)
CO2: 27 mmol/L (ref 21–32)
Calcium: 8.8 MG/DL (ref 8.3–10.4)
Chloride: 109 mmol/L — ABNORMAL HIGH (ref 98–107)
Creatinine: 0.85 MG/DL (ref 0.8–1.5)
EGFR IF NonAfrican American: >60 mL/min/{1.73_m2} (ref >60)
GFR African American: >60 mL/min/{1.73_m2} (ref >60)
Globulin: 3.9 g/dL — ABNORMAL HIGH (ref 2.3–3.5)
Glucose: 97 mg/dL (ref 65–100)
Potassium: 4.3 mmol/L (ref 3.5–5.1)
Sodium: 140 mmol/L (ref 138–145)
Total Bilirubin: 0.8 MG/DL (ref 0.2–1.1)
Total Protein: 7.4 g/dL (ref 6.3–8.2)

## 2020-01-19 LAB — CBC WITH AUTO DIFFERENTIAL
Basophils %: 0 % (ref 0.0–2.0)
Basophils Absolute: 0 10*3/uL (ref 0.0–0.2)
Eosinophils %: 3 % (ref 0.5–7.8)
Eosinophils Absolute: 0.2 10*3/uL (ref 0.0–0.8)
Granulocyte Absolute Count: 0 10*3/uL (ref 0.0–0.5)
Hematocrit: 44.8 % (ref 41.1–50.3)
Hemoglobin: 14.7 g/dL (ref 13.6–17.2)
Immature Granulocytes: 0 % (ref 0.0–5.0)
Lymphocytes %: 15 % (ref 13–44)
Lymphocytes Absolute: 1 10*3/uL (ref 0.5–4.6)
MCH: 30.1 PG (ref 26.1–32.9)
MCHC: 32.8 g/dL (ref 31.4–35.0)
MCV: 91.8 FL (ref 79.6–97.8)
MPV: 10.2 FL (ref 9.4–12.3)
Monocytes %: 14 % — ABNORMAL HIGH (ref 4.0–12.0)
Monocytes Absolute: 0.9 10*3/uL (ref 0.1–1.3)
NRBC Absolute: 0 10*3/uL (ref 0.0–0.2)
Neutrophils %: 68 % (ref 43–78)
Neutrophils Absolute: 4.5 10*3/uL (ref 1.7–8.2)
Platelets: 200 10*3/uL (ref 150–450)
RBC: 4.88 M/uL (ref 4.23–5.6)
RDW: 12.9 % (ref 11.9–14.6)
WBC: 6.5 10*3/uL (ref 4.3–11.1)

## 2020-01-19 LAB — EKG 12-LEAD
Atrial Rate: 97 {beats}/min
Diagnosis: NORMAL
P Axis: 70 degrees
P-R Interval: 170 ms
Q-T Interval: 324 ms
QRS Duration: 84 ms
QTc Calculation (Bazett): 411 ms
R Axis: 53 degrees
T Axis: 34 degrees
Ventricular Rate: 97 {beats}/min

## 2020-01-19 LAB — ETHYL ALCOHOL
ALCOHOL(ETHYL),SERUM: <3 MG/DL
Ethyl Alcohol: <3 MG/DL

## 2020-01-19 LAB — SALICYLATE
Salicylate level: 1.8 MG/DL — ABNORMAL LOW (ref 2.8–20.0)
Salicylate: 1.8 MG/DL — ABNORMAL LOW (ref 2.8–20.0)

## 2020-01-19 LAB — MAGNESIUM
Magnesium: 2 mg/dL (ref 1.8–2.4)
Magnesium: 2 mg/dL (ref 1.8–2.4)

## 2020-01-19 LAB — ACETAMINOPHEN LEVEL: Acetaminophen Level: <2 ug/mL — ABNORMAL LOW (ref 10.0–30.0)

## 2020-01-19 LAB — CBC WITH AUTOMATED DIFF
ABS. BASOPHILS: 0 10*3/uL (ref 0.0–0.2)
ABS. EOSINOPHILS: 0.2 10*3/uL (ref 0.0–0.8)
ABS. IMM. GRANS.: 0 10*3/uL (ref 0.0–0.5)
ABS. LYMPHOCYTES: 1 10*3/uL (ref 0.5–4.6)
ABS. MONOCYTES: 0.9 10*3/uL (ref 0.1–1.3)
ABS. NEUTROPHILS: 4.5 10*3/uL (ref 1.7–8.2)
ABSOLUTE NRBC: 0 10*3/uL (ref 0.0–0.2)
BASOPHILS: 0 % (ref 0.0–2.0)
EOSINOPHILS: 3 % (ref 0.5–7.8)
HCT: 44.8 % (ref 41.1–50.3)
HGB: 14.7 g/dL (ref 13.6–17.2)
IMMATURE GRANULOCYTES: 0 % (ref 0.0–5.0)
LYMPHOCYTES: 15 % (ref 13–44)
MCH: 30.1 PG (ref 26.1–32.9)
MCHC: 32.8 g/dL (ref 31.4–35.0)
MCV: 91.8 FL (ref 79.6–97.8)
MONOCYTES: 14 % — ABNORMAL HIGH (ref 4.0–12.0)
MPV: 10.2 FL (ref 9.4–12.3)
NEUTROPHILS: 68 % (ref 43–78)
PLATELET: 200 10*3/uL (ref 150–450)
RBC: 4.88 M/uL (ref 4.23–5.6)
RDW: 12.9 % (ref 11.9–14.6)
WBC: 6.5 10*3/uL (ref 4.3–11.1)

## 2020-01-19 LAB — EKG, 12 LEAD, INITIAL
Atrial Rate: 97 {beats}/min
Calculated P Axis: 70 degrees
Calculated R Axis: 53 degrees
Calculated T Axis: 34 degrees
Diagnosis: NORMAL
P-R Interval: 170 ms
Q-T Interval: 324 ms
QRS Duration: 84 ms
QTC Calculation (Bezet): 411 ms
Ventricular Rate: 97 {beats}/min

## 2020-01-19 LAB — METABOLIC PANEL, COMPREHENSIVE
A-G Ratio: 0.9 — ABNORMAL LOW (ref 1.2–3.5)
ALT (SGPT): 25 U/L (ref 12–65)
AST (SGOT): 14 U/L — ABNORMAL LOW (ref 15–37)
Albumin: 3.5 g/dL (ref 3.5–5.0)
Alk. phosphatase: 122 U/L (ref 50–136)
Anion gap: 4 mmol/L — ABNORMAL LOW (ref 7–16)
BUN: 12 MG/DL (ref 6–23)
Bilirubin, total: 0.8 MG/DL (ref 0.2–1.1)
CO2: 27 mmol/L (ref 21–32)
Calcium: 8.8 MG/DL (ref 8.3–10.4)
Chloride: 109 mmol/L — ABNORMAL HIGH (ref 98–107)
Creatinine: 0.85 MG/DL (ref 0.8–1.5)
GFR est AA: >60 mL/min/{1.73_m2} (ref >60)
GFR est non-AA: >60 mL/min/{1.73_m2} (ref >60)
Globulin: 3.9 g/dL — ABNORMAL HIGH (ref 2.3–3.5)
Glucose: 97 mg/dL (ref 65–100)
Potassium: 4.3 mmol/L (ref 3.5–5.1)
Protein, total: 7.4 g/dL (ref 6.3–8.2)
Sodium: 140 mmol/L (ref 138–145)

## 2020-01-19 LAB — ACETAMINOPHEN: Acetaminophen level: <2 ug/mL — ABNORMAL LOW (ref 10.0–30.0)

## 2020-01-19 NOTE — ED Notes (Signed)
Patient left without discharge paperwork or discharge vitals

## 2020-01-19 NOTE — ED Provider Notes (Signed)
45 year old male with history of polysubstance use, homelessness presents with complaint of "I need somewhere to stay because I am homeless".  Unlike triage documentation, patient denies suicidal ideation.  Patient denies plan to harm himself.  Patient states that he is contacted local [facility].  Patient states that he does not want to go there because he will be unable to use drugs.  States that he uses meth.  States that the true reason he is here is to have somewhere warm to stay because this process noted night.  Denies SI, HI, AVH, paranoid delusions.  Patient denies chest pain, shortness breath, nausea, vomiting or abdominal pain.    The history is provided by the patient. No language interpreter was used.   Mental Health Problem   Pertinent negatives include no confusion, no weakness, no agitation, no hallucinations and no self-injury.        No past medical history on file.    No past surgical history on file.      No family history on file.    Social History     Socioeconomic History   ??? Marital status: Not on file     Spouse name: Not on file   ??? Number of children: Not on file   ??? Years of education: Not on file   ??? Highest education level: Not on file   Occupational History   ??? Not on file   Tobacco Use   ??? Smoking status: Not on file   ??? Smokeless tobacco: Not on file   Substance and Sexual Activity   ??? Alcohol use: Not on file   ??? Drug use: Not on file   ??? Sexual activity: Not on file   Other Topics Concern   ??? Not on file   Social History Narrative   ??? Not on file     Social Determinants of Health     Financial Resource Strain:    ??? Difficulty of Paying Living Expenses: Not on file   Food Insecurity:    ??? Worried About Running Out of Food in the Last Year: Not on file   ??? Ran Out of Food in the Last Year: Not on file   Transportation Needs:    ??? Lack of Transportation (Medical): Not on file   ??? Lack of Transportation (Non-Medical): Not on file   Physical Activity:    ??? Days of Exercise  per Week: Not on file   ??? Minutes of Exercise per Session: Not on file   Stress:    ??? Feeling of Stress : Not on file   Social Connections:    ??? Frequency of Communication with Friends and Family: Not on file   ??? Frequency of Social Gatherings with Friends and Family: Not on file   ??? Attends Religious Services: Not on file   ??? Active Member of Clubs or Organizations: Not on file   ??? Attends Archivist Meetings: Not on file   ??? Marital Status: Not on file   Intimate Partner Violence:    ??? Fear of Current or Ex-Partner: Not on file   ??? Emotionally Abused: Not on file   ??? Physically Abused: Not on file   ??? Sexually Abused: Not on file   Housing Stability:    ??? Unable to Pay for Housing in the Last Year: Not on file   ??? Number of Places Lived in the Last Year: Not on file   ??? Unstable Housing in the  Last Year: Not on file         ALLERGIES: Patient has no known allergies.    Review of Systems   Constitutional: Negative for diaphoresis, fatigue and fever.   HENT: Negative for congestion and rhinorrhea.    Eyes: Negative for visual disturbance.   Respiratory: Negative for cough and shortness of breath.    Cardiovascular: Negative for chest pain.   Gastrointestinal: Negative for abdominal pain, diarrhea, nausea and vomiting.   Genitourinary: Negative for dysuria and flank pain.   Musculoskeletal: Negative for back pain and myalgias.   Skin: Negative for rash.   Neurological: Negative for dizziness, weakness, light-headedness and headaches.   Hematological: Does not bruise/bleed easily.   Psychiatric/Behavioral: Negative for agitation, confusion, hallucinations and self-injury. The patient is not nervous/anxious.        Vitals:    01/19/20 0741   BP: 118/70   Pulse: (!) 102   Resp: 16   Temp: 98.5 ??F (36.9 ??C)   SpO2: 97%   Weight: 83.9 kg (185 lb)   Height: '6\' 2"'  (1.88 m)            Physical Exam  Vitals and nursing note reviewed.   Constitutional:       Appearance: Normal appearance.   HENT:      Head:  Normocephalic.      Nose: Nose normal.      Mouth/Throat:      Mouth: Mucous membranes are moist.   Eyes:      Extraocular Movements: Extraocular movements intact.      Pupils: Pupils are equal, round, and reactive to light.   Cardiovascular:      Rate and Rhythm: Normal rate.      Pulses: Normal pulses.      Heart sounds: Normal heart sounds.   Pulmonary:      Effort: Pulmonary effort is normal.      Breath sounds: Normal breath sounds.   Abdominal:      General: Bowel sounds are normal.      Palpations: Abdomen is soft.      Tenderness: There is no abdominal tenderness. There is no guarding or rebound.   Musculoskeletal:         General: Normal range of motion.   Skin:     Findings: No erythema or rash.   Neurological:      General: No focal deficit present.      Mental Status: He is alert and oriented to person, place, and time.      Cranial Nerves: No cranial nerve deficit.      Motor: No weakness.      Comments: No focal deficits.   Psychiatric:         Mood and Affect: Mood normal.         Thought Content: Thought content normal.      Comments: Denies SI, HI, AVH, paranoid delusions          MDM  Number of Diagnoses or Management Options  Lack of housing: new and requires workup  Diagnosis management comments: Vital signs stable.  Labs unremarkable.  Patient with no plan to harm himself or others.    Denied SI on numerous occasions.  Patient states that he is not suicidal and that only reason he is here is that he needs somewhere warm to stay because of his supposed to snow tonight.  States that he wants to continue drug use and would not be allowed at the [facility].   I contacted [facility] shelter and states stated that he would need to present at [address] at 8 PM.  Patient exited ED before I was able to update him on plan.       Amount and/or Complexity of Data Reviewed  Clinical lab tests: ordered and reviewed  Tests in the medicine section of CPT??: ordered and reviewed  Review  and summarize past medical records: yes  Independent visualization of images, tracings, or specimens: yes    Risk of Complications, Morbidity, and/or Mortality  Presenting problems: moderate  Diagnostic procedures: moderate  Management options: moderate    Patient Progress  Patient progress: stable         EKG    Date/Time: 01/19/2020 9:43 AM  Performed by: Flowers, Osmel Dykstra Rich Reining., MD  Authorized by: Flowers, Mehar Kirkwood Rich Reining., MD     ECG reviewed by ED Physician in the absence of a cardiologist: yes    Rate:     ECG rate:  97    ECG rate assessment: normal    Rhythm:     Rhythm: sinus rhythm    Ectopy:     Ectopy: none    QRS:     QRS axis:  Normal    QRS intervals:  Normal  Conduction:     Conduction: normal    ST segments:     ST segments:  Normal  T waves:     T waves: normal                     Results Include:    Recent Results (from the past 24 hour(s))   CBC WITH AUTOMATED DIFF    Collection Time: 01/19/20  8:19 AM   Result Value Ref Range    WBC 6.5 4.3 - 11.1 K/uL    RBC 4.88 4.23 - 5.6 M/uL    HGB 14.7 13.6 - 17.2 g/dL    HCT 44.8 41.1 - 50.3 %    MCV 91.8 79.6 - 97.8 FL    MCH 30.1 26.1 - 32.9 PG    MCHC 32.8 31.4 - 35.0 g/dL    RDW 12.9 11.9 - 14.6 %    PLATELET 200 150 - 450 K/uL    MPV 10.2 9.4 - 12.3 FL    ABSOLUTE NRBC 0.00 0.0 - 0.2 K/uL    DF AUTOMATED      NEUTROPHILS 68 43 - 78 %    LYMPHOCYTES 15 13 - 44 %    MONOCYTES 14 (H) 4.0 - 12.0 %    EOSINOPHILS 3 0.5 - 7.8 %    BASOPHILS 0 0.0 - 2.0 %    IMMATURE GRANULOCYTES 0 0.0 - 5.0 %    ABS. NEUTROPHILS 4.5 1.7 - 8.2 K/UL    ABS. LYMPHOCYTES 1.0 0.5 - 4.6 K/UL    ABS. MONOCYTES 0.9 0.1 - 1.3 K/UL    ABS. EOSINOPHILS 0.2 0.0 - 0.8 K/UL    ABS. BASOPHILS 0.0 0.0 - 0.2 K/UL    ABS. IMM. GRANS. 0.0 0.0 - 0.5 K/UL   METABOLIC PANEL, COMPREHENSIVE    Collection Time: 01/19/20  8:19 AM   Result Value Ref Range    Sodium 140 138 - 145 mmol/L    Potassium 4.3 3.5 - 5.1 mmol/L    Chloride 109 (H) 98 - 107 mmol/L    CO2 27 21 - 32 mmol/L    Anion gap 4 (L)  7 - 16 mmol/L  Glucose 97 65 - 100 mg/dL    BUN 12 6 - 23 MG/DL    Creatinine 0.85 0.8 - 1.5 MG/DL    GFR est AA >60 >60 ml/min/1.69m    GFR est non-AA >60 >60 ml/min/1.720m   Calcium 8.8 8.3 - 10.4 MG/DL    Bilirubin, total 0.8 0.2 - 1.1 MG/DL    ALT (SGPT) 25 12 - 65 U/L    AST (SGOT) 14 (L) 15 - 37 U/L    Alk. phosphatase 122 50 - 136 U/L    Protein, total 7.4 6.3 - 8.2 g/dL    Albumin 3.5 3.5 - 5.0 g/dL    Globulin 3.9 (H) 2.3 - 3.5 g/dL    A-G Ratio 0.9 (L) 1.2 - 3.5     SALICYLATE    Collection Time: 01/19/20  8:19 AM   Result Value Ref Range    Salicylate level 1.8 (L) 2.8 - 20.0 MG/DL   ACETAMINOPHEN    Collection Time: 01/19/20  8:19 AM   Result Value Ref Range    Acetaminophen level <2 (L) 10.0 - 30.0 ug/mL   ETHYL ALCOHOL    Collection Time: 01/19/20  8:19 AM   Result Value Ref Range    ALCOHOL(ETHYL),SERUM <3 MG/DL   MAGNESIUM    Collection Time: 01/19/20  8:19 AM   Result Value Ref Range    Magnesium 2.0 1.8 - 2.4 mg/dL   EKG, 12 LEAD, INITIAL    Collection Time: 01/19/20  8:22 AM   Result Value Ref Range    Ventricular Rate 97 BPM    Atrial Rate 97 BPM    P-R Interval 170 ms    QRS Duration 84 ms    Q-T Interval 324 ms    QTC Calculation (Bezet) 411 ms    Calculated P Axis 70 degrees    Calculated R Axis 53 degrees    Calculated T Axis 34 degrees    Diagnosis       Normal sinus rhythm  Septal infarct , age undetermined  Abnormal ECG  No previous ECGs available       Malaquias Lenker Steger FlShirlean Schlein MD; 01/19/2020 '@9' :43 AM Voice dictation software was used during the making of this note.  This software is not perfect and grammatical and other typographical errors may be present.  This note has not been proofread for errors.  ===================================================================

## 2020-01-19 NOTE — ED Notes (Signed)
Pt arrived via EMS with c/o visual and auditory hallucinations. Pt told EMS he was suicidal if he stayed on the streets. VSS en route.

## 2020-02-10 ENCOUNTER — Ambulatory Visit (HOSPITAL_COMMUNITY)
Admission: EM | Admit: 2020-02-10 | Discharge: 2020-02-10 | Disposition: A | Discharge status: Home / Self Care | Payer: No Payment, Other | Attending: Family | Admitting: Family

## 2020-02-10 ENCOUNTER — Other Ambulatory Visit: Payer: Self-pay

## 2020-02-10 DIAGNOSIS — F339 Major depressive disorder, recurrent, unspecified: Secondary | ICD-10-CM

## 2020-02-10 DIAGNOSIS — F1994 Other psychoactive substance use, unspecified with psychoactive substance-induced mood disorder: Secondary | ICD-10-CM

## 2020-02-10 MED ORDER — GABAPENTIN 600 MG PO TABS
300.0000 mg | ORAL_TABLET | Freq: Two times a day (BID) | ORAL | Status: DC
Start: 2020-02-10 — End: 2020-02-10

## 2020-02-10 MED ORDER — ZYPREXA 5 MG PO TABS
5.0000 mg | ORAL_TABLET | Freq: Every day | ORAL | 0 refills | Status: AC
Start: 2020-02-10 — End: 2021-02-09

## 2020-02-10 MED ORDER — LEVETIRACETAM 500 MG PO TABS
500.0000 mg | ORAL_TABLET | Freq: Two times a day (BID) | ORAL | Status: DC
Start: 2020-02-10 — End: 2020-02-10

## 2020-02-10 MED ORDER — GABAPENTIN 600 MG PO TABS: 300.0000 mg | ORAL_TABLET | Freq: Two times a day (BID) | ORAL | 0 refills | Status: AC

## 2020-02-10 MED ORDER — GABAPENTIN 600 MG PO TABS: 300.0000 mg | ORAL_TABLET | Freq: Two times a day (BID) | ORAL | 0 refills | Status: DC

## 2020-02-10 MED ORDER — LEVETIRACETAM ER 500 MG PO TB24
500.0000 mg | ORAL_TABLET | Freq: Once | ORAL | Status: DC
Start: 2020-02-10 — End: 2020-02-10

## 2020-02-10 MED ORDER — OLANZAPINE 5 MG PO TABS
5.0000 mg | ORAL_TABLET | Freq: Every day | ORAL | Status: DC
Start: 2020-02-10 — End: 2020-02-10

## 2020-02-10 MED ORDER — LEVETIRACETAM 500 MG PO TABS: 500.0000 mg | ORAL_TABLET | Freq: Two times a day (BID) | ORAL | 0 refills | Status: AC

## 2020-02-10 MED ORDER — TRAZODONE HCL 50 MG PO TABS
50.0000 mg | ORAL_TABLET | Freq: Every evening | ORAL | Status: DC | PRN
Start: 2020-02-10 — End: 2020-02-10

## 2020-02-10 MED ORDER — TRAZODONE HCL 50 MG PO TABS: 50.0000 mg | ORAL_TABLET | Freq: Every evening | ORAL | 0 refills | Status: AC | PRN

## 2020-02-10 NOTE — ED Notes (Signed)
Patient A&O x 4, ambulatory. Patient discharged in no acute distress. Patient denied SI/HI, A/VH upon discharge. Patient verbalized understanding of all discharge instructions explained by staff, to include follow up appointments, RX's and safety plan. Patient escorted to lobby via staff for destination to home. Safety maintained.
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
## 2020-02-10 NOTE — Discharge Instructions (Signed)
Take all medications as prescribed. Keep all follow-up appointments as scheduled.  Do not consume alcohol or use illegal drugs while on prescription medications. Report any adverse effects from your medications to your primary care provider promptly.  In the event of recurrent symptoms or worsening symptoms, call 911, a crisis hotline, or go to the nearest emergency department for evaluation.   

## 2020-02-25 ENCOUNTER — Ambulatory Visit (HOSPITAL_COMMUNITY): Payer: No Payment, Other | Admitting: Behavioral Health

## 2020-09-23 ENCOUNTER — Inpatient Hospital Stay (HOSPITAL_COMMUNITY): Admission: AD | Admit: 2020-09-23 | Payer: No Payment, Other | Source: Intra-hospital | Admitting: Emergency Medicine

## 2020-09-23 ENCOUNTER — Other Ambulatory Visit: Payer: Self-pay

## 2020-09-23 ENCOUNTER — Emergency Department (HOSPITAL_COMMUNITY)
Admission: EM | Admit: 2020-09-23 | Discharge: 2020-09-24 | Disposition: A | Discharge status: Home / Self Care | Payer: Self-pay | Attending: Emergency Medicine | Admitting: Emergency Medicine

## 2020-09-23 ENCOUNTER — Ambulatory Visit (HOSPITAL_COMMUNITY)
Admission: EM | Admit: 2020-09-23 | Discharge: 2020-09-23 | Disposition: A | Discharge status: Other Acute Inpatient Hospital | Payer: No Payment, Other | Attending: Registered Nurse | Admitting: Registered Nurse

## 2020-09-23 ENCOUNTER — Encounter (HOSPITAL_COMMUNITY): Payer: Self-pay | Admitting: Registered Nurse

## 2020-09-23 ENCOUNTER — Encounter (HOSPITAL_COMMUNITY): Payer: Self-pay | Admitting: Emergency Medicine

## 2020-09-23 DIAGNOSIS — Y909 Presence of alcohol in blood, level not specified: Secondary | ICD-10-CM | POA: Insufficient documentation

## 2020-09-23 DIAGNOSIS — R45851 Suicidal ideations: Secondary | ICD-10-CM | POA: Diagnosis not present

## 2020-09-23 DIAGNOSIS — J45909 Unspecified asthma, uncomplicated: Secondary | ICD-10-CM | POA: Insufficient documentation

## 2020-09-23 DIAGNOSIS — Z79899 Other long term (current) drug therapy: Secondary | ICD-10-CM | POA: Insufficient documentation

## 2020-09-23 DIAGNOSIS — Z5902 Unsheltered homelessness: Secondary | ICD-10-CM | POA: Diagnosis not present

## 2020-09-23 DIAGNOSIS — F1924 Other psychoactive substance dependence with psychoactive substance-induced mood disorder: Secondary | ICD-10-CM | POA: Insufficient documentation

## 2020-09-23 DIAGNOSIS — Z20822 Contact with and (suspected) exposure to covid-19: Secondary | ICD-10-CM | POA: Insufficient documentation

## 2020-09-23 DIAGNOSIS — F1910 Other psychoactive substance abuse, uncomplicated: Secondary | ICD-10-CM | POA: Diagnosis present

## 2020-09-23 DIAGNOSIS — F1920 Other psychoactive substance dependence, uncomplicated: Secondary | ICD-10-CM

## 2020-09-23 DIAGNOSIS — F1994 Other psychoactive substance use, unspecified with psychoactive substance-induced mood disorder: Secondary | ICD-10-CM | POA: Diagnosis present

## 2020-09-23 DIAGNOSIS — F259 Schizoaffective disorder, unspecified: Secondary | ICD-10-CM | POA: Diagnosis not present

## 2020-09-23 DIAGNOSIS — F1510 Other stimulant abuse, uncomplicated: Secondary | ICD-10-CM | POA: Diagnosis present

## 2020-09-23 DIAGNOSIS — R443 Hallucinations, unspecified: Secondary | ICD-10-CM | POA: Insufficient documentation

## 2020-09-23 DIAGNOSIS — F17210 Nicotine dependence, cigarettes, uncomplicated: Secondary | ICD-10-CM | POA: Insufficient documentation

## 2020-09-23 LAB — CBC WITH DIFFERENTIAL/PLATELET
Abs Immature Granulocytes: 0.01 10*3/uL (ref 0.00–0.07)
Basophils Absolute: 0 10*3/uL (ref 0.0–0.1)
Basophils Relative: 0 %
Eosinophils Absolute: 0.4 10*3/uL (ref 0.0–0.5)
Eosinophils Relative: 8 %
HCT: 45.8 % (ref 39.0–52.0)
Hemoglobin: 15 g/dL (ref 13.0–17.0)
Immature Granulocytes: 0 %
Lymphocytes Relative: 34 %
Lymphs Abs: 1.6 10*3/uL (ref 0.7–4.0)
MCH: 31.1 pg (ref 26.0–34.0)
MCHC: 32.8 g/dL (ref 30.0–36.0)
MCV: 94.8 fL (ref 80.0–100.0)
Monocytes Absolute: 0.6 10*3/uL (ref 0.1–1.0)
Monocytes Relative: 13 %
Neutro Abs: 2 10*3/uL (ref 1.7–7.7)
Neutrophils Relative %: 45 %
Platelets: 259 10*3/uL (ref 150–400)
RBC: 4.83 MIL/uL (ref 4.22–5.81)
RDW: 11.9 % (ref 11.5–15.5)
WBC: 4.6 10*3/uL (ref 4.0–10.5)
nRBC: 0 % (ref 0.0–0.2)

## 2020-09-23 LAB — COMPREHENSIVE METABOLIC PANEL
ALT: 14 U/L (ref 0–44)
AST: 17 U/L (ref 15–41)
Albumin: 3.3 g/dL — ABNORMAL LOW (ref 3.5–5.0)
Alkaline Phosphatase: 70 U/L (ref 38–126)
Anion gap: 5 (ref 5–15)
BUN: 11 mg/dL (ref 6–20)
CO2: 30 mmol/L (ref 22–32)
Calcium: 8.5 mg/dL — ABNORMAL LOW (ref 8.9–10.3)
Chloride: 105 mmol/L (ref 98–111)
Creatinine, Ser: 0.95 mg/dL (ref 0.61–1.24)
GFR, Estimated: >60 mL/min (ref >60)
Glucose, Bld: 85 mg/dL (ref 70–99)
Potassium: 4 mmol/L (ref 3.5–5.1)
Sodium: 140 mmol/L (ref 135–145)
Total Bilirubin: 0.7 mg/dL (ref 0.3–1.2)
Total Protein: 6.2 g/dL — ABNORMAL LOW (ref 6.5–8.1)

## 2020-09-23 LAB — ETHANOL: Alcohol, Ethyl (B): <10 mg/dL (ref <10)

## 2020-09-23 LAB — RAPID URINE DRUG SCREEN, HOSP PERFORMED
Amphetamines: POSITIVE — AB
Barbiturates: NOT DETECTED
Benzodiazepines: NOT DETECTED
Cocaine: NOT DETECTED
Opiates: NOT DETECTED
Tetrahydrocannabinol: POSITIVE — AB

## 2020-09-23 LAB — RESP PANEL BY RT-PCR (FLU A&B, COVID) ARPGX2
Influenza A by PCR: NEGATIVE
Influenza B by PCR: NEGATIVE
SARS Coronavirus 2 by RT PCR: NEGATIVE

## 2020-09-23 MED ORDER — MAGNESIUM HYDROXIDE 400 MG/5ML PO SUSP: 30.0000 mL | Freq: Every day | ORAL | Status: DC | PRN

## 2020-09-23 MED ORDER — ACETAMINOPHEN 325 MG PO TABS: 650.0000 mg | ORAL_TABLET | Freq: Four times a day (QID) | ORAL | Status: DC | PRN

## 2020-09-23 MED ORDER — NICOTINE 21 MG/24HR TD PT24
21.0000 mg | MEDICATED_PATCH | Freq: Once | TRANSDERMAL | Status: DC
  Administered 2020-09-23: 21 mg via TRANSDERMAL
  Filled 2020-09-23: qty 1

## 2020-09-23 MED ORDER — ALUM & MAG HYDROXIDE-SIMETH 200-200-20 MG/5ML PO SUSP: 30.0000 mL | ORAL | Status: DC | PRN

## 2020-09-23 NOTE — Progress Notes (Signed)
[name] to be transferred to [facility] per NP order. An After Visit Summary, EMTALA and Med necessity were printed and given to the receiving nurse. Patient escorted out and transferred via [employer]. Dickie La  09/23/2020 4:51 PM

## 2020-09-23 NOTE — Progress Notes (Signed)
Pending Bed offer at [facility]-pending behaviors and documentation.  Per [facility] AC Malva Limes, RN pt has been accepted to [facility] bed assignment 403-1. Attending Haze Rushing MD. Pt must sign vol. consent and fax that back to [phone]. Report to [phone] once all labs result and covid-19 negative. Dx Polysubstance Abuse and Substance Induced Mood D.o. pending pt's behaviors. Pt can admit if pt he can tolerate a roommate. At this time [facility] approval is still pending per documentation to review pt's behaviors per [facility].    Maryjean Ka, MSW, [facility] 09/23/2020 11:27 PM

## 2020-09-23 NOTE — Progress Notes (Signed)
   09/23/20 1357  [facility] Triage Screening (Walk-ins at [facility] only)  How Did You Hear About [place]? Family/Friend  What Is the Reason for Your Visit/Call Today? Patient presents with [facility] Case Manager for assessment after he shared with her that he has been having suicidal thoughts.  Patient is working with [facility] on housing option, however the wait list is rather long.  Patient reports he has been using meth daily for the past year and heroinand fentanyl daily for the past week.  Last meth use was two days ago.  Patient becomes irritable during assessment, especially when substance history is discussed in relation to current symptoms.  He states, "Well [facility] diagnosed me with Schizophrenia."  He was last admitted to inpatient program at [facility] earlier this year.  He states he tood Rx meds until they ran out and "just didn't go back to get them refilled."  Patient is endorsing command hallucinations to harm himself and others, however he will not elaborate as to content of hallucinations stating, "I don't want to discus that."  Patient is easily irritated by questions, often answering with "lady, I don't care. If you're not going to help me I'll leave."  He then mentions he has a pistol he got from his brother, which is hidden.  He refuses to disclose the location of the gun. When inpatient treatment was discussed, patient stated, "It doesn't matter, I'll kill myself with the gun when I get out."  He continues to endorse SI, however is adamant that he doesn't want to be transferred to the ED.  Patient denies HI.  How Long Has This Been Causing You Problems? > than 6 months  Have You Recently Had Any Thoughts About Hurting Yourself? Yes  How long ago did you have thoughts about hurting yourself? today  Are You Planning to Commit Suicide/Harm Yourself At This time? Yes (states voices are telling him to shoot himself, states he has access to pistol)  Have you Recently Had Thoughts About Hurting Someone [name]? No  Are You  Planning To Harm Someone At This Time? No  Are you currently experiencing any auditory, visual or other hallucinations? Yes  Please explain the hallucinations you are currently experiencing: AH to shoot himself - also command AH telling him to do other things to self  Have You Used Any Alcohol or Drugs in the Past 24 Hours? Yes  How long ago did you use Drugs or Alcohol? 2 days ago  What Did You Use and How Much? meth and heroin - amts unknown  Do you have any current medical co-morbidities that require immediate attention? No  Clinician description of patient physical appearance/behavior: Patient is irritable and demanding - appears to be malingering for shelter based on responses - then asks for food  What Do You Feel Would Help You the Most Today? Treatment for Depression or other mood problem;Alcohol or Drug Use Treatment  If access to [facility] was not available, would you have sought care in the Emergency Department? Yes  Determination of Need Emergent (2 hours)  Options For Referral Inpatient Hospitalization

## 2020-09-23 NOTE — ED Provider Notes (Signed)
Emergency Medicine Provider Triage Evaluation Note  Fernando [name] , a 45 y.o. male  was evaluated in triage.  Pt brought by [employer] from [facility] under IVF. Documentation states he "endorses command hallucinations, telling him to shoot himself. Unable to contract for safety." Hx of meth and heroin use. Hx of 2 past suicide attempts.   Review of Systems  Positive: Behavioral problem Negative: SI, HI, auditory or visual hallucinations  Physical Exam  BP 122/84 (BP Location: Right Arm)   Pulse 76   Temp 98.9 F (37.2 C) (Oral)   Resp 18   SpO2 99%  Gen:   Awake, no distress   Resp:  Normal effort  MSK:   Moves extremities without difficulty  Other:    Medical Decision Making  Medically screening exam initiated at 5:32 PM.  Appropriate orders placed.  Fernando [name] was informed that the remainder of the evaluation will be completed by another provider, this initial triage assessment does not replace that evaluation, and the importance of remaining in the ED until their evaluation is complete.     Jeanella Flattery 09/23/20 1735    Tegeler, Canary Brim, MD 09/23/20 1900

## 2020-09-23 NOTE — ED Notes (Signed)
Pt refused labs.  

## 2020-09-23 NOTE — ED Provider Notes (Signed)
[name] EMERGENCY DEPARTMENT Provider Note   CSN: [id number] Arrival date & time: 09/23/20  1704     History Chief Complaint  Patient presents with   IVC    Fernando [name] is a 45 y.o. male.  45 year old male with prior medical history as detailed below presents from [name] under IVC.  Patient apparently presented there with reported command hallucinations.  He was reporting suicidal ideation.  He reported that command hallucinations were instructing him to shoot himself.  Upon arrival to the ED, and at the time of my evaluation, the patient is now denying any hallucination.  He denies suicidality.  The history is provided by the patient.  Illness Location:  Arrives from [facility] with IVC Severity:  Mild Onset quality:  Unable to specify Timing:  Unable to specify Progression:  Unable to specify     Past Medical History:  Diagnosis Date   Asthma    Seizures (HCC)    Stroke [facility])     Patient Active Problem List   Diagnosis Date Noted   Polysubstance abuse (HCC) 09/23/2020   Methamphetamine abuse (HCC) 09/23/2020   Suicidal ideation 09/23/2020   Polysubstance dependence including opioid type drug without complication, episodic abuse (HCC) 11/27/2019   Substance induced mood disorder (HCC) 11/27/2019    Past Surgical History:  Procedure Laterality Date   APPENDECTOMY         No family history on file.  Social History   Tobacco Use   Smoking status: Every Day    Packs/day: 2.00    Years: 15.00    Pack years: 30.00    Types: Cigarettes   Smokeless tobacco: Current  Vaping Use   Vaping Use: Former  Substance Use Topics   Alcohol use: Not Currently   Drug use: Not Currently    Home Medications Prior to Admission medications   Medication Sig Start Date End Date Taking? Authorizing Provider  levETIRAcetam (KEPPRA) 500 MG tablet Take 1 tablet (500 mg total) by mouth 2 (two) times daily. 02/10/20  Yes Oneta Rack, NP  gabapentin  (NEURONTIN) 600 MG tablet Take 0.5 tablets (300 mg total) by mouth 2 (two) times daily. Patient not taking: No sig reported 02/10/20   Oneta Rack, NP  traZODone (DESYREL) 50 MG tablet Take 1 tablet (50 mg total) by mouth at bedtime as needed for sleep. Patient not taking: No sig reported 02/10/20   Oneta Rack, NP  ZYPREXA 5 MG tablet Take 1 tablet (5 mg total) by mouth at bedtime. Patient not taking: No sig reported 02/10/20 02/09/21  Oneta Rack, NP    Allergies    Patient has no known allergies.  Review of Systems   Review of Systems  All other systems reviewed and are negative.  Physical Exam Updated Vital Signs BP 122/84 (BP Location: Right Arm)   Pulse 76   Temp 98.9 F (37.2 C) (Oral)   Resp 18   SpO2 99%   Physical Exam Vitals and nursing note reviewed.  Constitutional:      General: He is not in acute distress.    Appearance: Normal appearance. He is well-developed.  HENT:     Head: Normocephalic and atraumatic.  Eyes:     Conjunctiva/sclera: Conjunctivae normal.     Pupils: Pupils are equal, round, and reactive to light.  Cardiovascular:     Rate and Rhythm: Normal rate and regular rhythm.     Heart sounds: Normal heart sounds.  Pulmonary:  Effort: Pulmonary effort is normal. No respiratory distress.     Breath sounds: Normal breath sounds.  Abdominal:     General: There is no distension.     Palpations: Abdomen is soft.     Tenderness: There is no abdominal tenderness.  Musculoskeletal:        General: No deformity. Normal range of motion.     Cervical back: Normal range of motion and neck supple.  Skin:    General: Skin is warm and dry.  Neurological:     General: No focal deficit present.     Mental Status: He is alert and oriented to person, place, and time.    ED Results / Procedures / Treatments   Labs (all labs ordered are listed, but only abnormal results are displayed) Labs Reviewed  RAPID URINE DRUG SCREEN, HOSP PERFORMED -  Abnormal; Notable for the following components:      Result Value   Amphetamines POSITIVE (*)    Tetrahydrocannabinol POSITIVE (*)    All other components within normal limits  RESP PANEL BY RT-PCR (FLU A&B, COVID) ARPGX2  COMPREHENSIVE METABOLIC PANEL  ETHANOL  CBC WITH DIFFERENTIAL/PLATELET    EKG None  Radiology No results found.  Procedures Procedures   Medications Ordered in ED Medications  nicotine (NICODERM CQ - dosed in mg/24 hours) patch 21 mg (21 mg Transdermal Patch Applied 09/23/20 2059)    ED Course  I have reviewed the triage vital signs and the nursing notes.  Pertinent labs & imaging results that were available during my care of the patient were reviewed by me and considered in my medical decision making (see chart for details).    MDM Rules/Calculators/A&P                           MDM  MSE complete  [name] was evaluated in Emergency Department on 09/23/2020 for the symptoms described in the history of present illness. He was evaluated in the context of the global COVID-19 pandemic, which necessitated consideration that the patient might be at risk for infection with the SARS-CoV-2 virus that causes COVID-19. Institutional protocols and algorithms that pertain to the evaluation of patients at risk for COVID-19 are in a state of rapid change based on information released by regulatory bodies including the CDC and federal and state organizations. These policies and algorithms were followed during the patient's care in the ED.  Patient sent from [facility] on IVC hold for evaluation.  He is medically clear at this time for pending psychiatric evaluation.  Final disposition dependent upon psych eval.  Final Clinical Impression(s) / ED Diagnoses Final diagnoses:  Hallucinations    Rx / DC Orders ED Discharge Orders     None        Wynetta Fines, MD 09/23/20 2149

## 2020-09-23 NOTE — Progress Notes (Signed)
Report given to Maralyn Sago, CN [facility]
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
## 2020-09-23 NOTE — ED Triage Notes (Signed)
Pt brought by [facility] from [facility]. IVC papers state "he endorses command hallucinations, stating voices are telling him to shoot himself." At this time pt is denying SI or hallucinations.

## 2020-09-23 NOTE — ED Provider Notes (Signed)
[facility] Medical Screening Exam  Patient Name: Fernando [name] MRN: [id number] Date of Evaluation: 09/23/20 Chief Complaint:   Diagnosis:  Final diagnoses:  Polysubstance abuse (HCC)  Polysubstance dependence including opioid type drug without complication, episodic abuse (HCC)  Substance induced mood disorder (HCC)  Suicidal ideation    History of Present illness: Fernando [name] is a 45 y.o. male. patient presented to [facility] as a walk in accompanied by PATH case manager with complaints of suicidal ideation, auditory/visual hallucinations, and polysubstance abuse  Fernando [name], 45 y.o., male patient seen face to face by this provider, consulted with Dr. Earlene Plater; and chart reviewed on 09/23/20.  On evaluation Fernando [name] reports he is having thoughts of killing himself.  States he went to his brothers and got his gun and now has it hid.  Patient states "I ain't telling you where I got it hid (referring to gun).  Patient reports suicide plans to either hang himself, "eat pills," or Patient states he is tired of life and "I just want to die."  Patient reports history of schizoaffective disorder and that he has been on psychotropics in the past but have been off of medications 6-7 months.  Patient states he is currently homeless living in a tent in the woods where he has his gun hid.   Patient refusing patient refusing lab or any treatment.  Patient states nobody is going to stick him.  Patient informed that he could voluntarily give the blood work or he would have to have IVC petition and sent to emergency room patient responded "I don't give a damn what you do ain't nobody sticking me." During evaluation Fernando [name] is sitting up in chair, in no acute distress.  Patient then ask for a blanket and a place to lay down.  Patient states that he wants to leave but "I ain't telling nobody where my gun is."  He is alert/oriented x 4.  Patient is irritable and uncooperative.   Patient is endorsing auditory/visual hallucination but does not appear to be responding to internal or external stimuli.  Patient reporting history of meth use and is aware that meth can also cause hallucinations.  He is speaking in a clear tone at moderate volume, and normal pace; with fair eye contact.  His thought process is coherent and relevant.  Patient does not appear to be experiencing delusional thinking; although he is endorsing auditory/visual hallucinations.  Patient denies homicidal ideation and paranoia.  Patient recommended for inpatient psychiatric treatment.  [facility] has mood disorder bed available but patient refusing to go voluntarily and refusing lab work.  IVC petition and patient will have to be transported to emergency room for medical clearance.  Psychiatric Specialty Exam  Presentation  General Appearance:Appropriate for Environment  Eye Contact:Fair  Speech:Clear and Coherent; Normal Rate  Speech Volume:Normal  Handedness:Right   Mood and Affect  Mood:Depressed; Irritable  Affect:Congruent   Thought Process  Thought Processes:Coherent; Goal Directed  Descriptions of Associations:Intact  Orientation:Full (Time, Place and Person)  Thought Content:WDL  Diagnosis of Schizophrenia or Schizoaffective disorder in past: Yes  Duration of Psychotic Symptoms: Greater than six months  Hallucinations:Auditory; Visual Reports he is hearing demonic voices telling him to kill himself and other "I'm seeeing my cousint that's been dead for 2 years"  Ideas of Reference:None  Suicidal Thoughts:Yes, Active With Intent; With Plan; With Means to Carry Out (States he got his gun from his brother house and has it hid. Will not state where  gun is) Without Intent; With Plan; With Means to Carry Out (Patient states that he really doesn't want to die just wants to get help with drugs.)  Homicidal Thoughts:No   Sensorium  Memory:Immediate Fair; Recent  Fair  Judgment:Fair  Insight:Lacking; Shallow   Executive Functions  Concentration:Good  Attention Span:Good  Recall:Fair  Fund of Knowledge:Fair  Language:Good   Psychomotor Activity  Psychomotor Activity:Normal   Assets  Assets:Communication Skills; Resilience; Social Support   Sleep  Sleep:Good  Number of hours:  No data recorded  Nutritional Assessment (For OBS and [facility]BC admissions only) Has the patient had a weight loss or gain of 10 pounds or more in the last 3 months?: No Has the patient had a decrease in food intake/or appetite?: No Does the patient have dental problems?: No Does the patient have eating habits or behaviors that may be indicators of an eating disorder including binging or inducing vomiting?: No Has the patient recently lost weight without trying?: 0 Has the patient been eating poorly because of a decreased appetite?: 0 Malnutrition Screening Tool Score: 0    Physical Exam: Physical Exam Vitals and nursing note reviewed. Exam conducted with a chaperone present.  Constitutional:      General: He is not in acute distress.    Appearance: Normal appearance. He is not ill-appearing.  Cardiovascular:     Rate and Rhythm: Normal rate.  Pulmonary:     Effort: Pulmonary effort is normal.  Musculoskeletal:        General: Normal range of motion.     Cervical back: Normal range of motion.  Skin:    General: Skin is warm and dry.  Neurological:     Mental Status: He is alert and oriented to person, place, and time.  Psychiatric:        Attention and Perception: He perceives auditory and visual hallucinations.        Mood and Affect: Mood is anxious. Affect is angry.        Speech: Speech normal.        Behavior: Behavior is agitated. Behavior is cooperative.        Thought Content: Thought content is not paranoid or delusional. Thought content includes suicidal ideation. Thought content does not include homicidal ideation. Thought content  includes suicidal plan.        Cognition and Memory: Cognition and memory normal.        Judgment: Judgment is impulsive.   Review of Systems  Constitutional: Negative.   HENT: Negative.    Eyes: Negative.   Respiratory: Negative.    Cardiovascular: Negative.   Gastrointestinal: Negative.   Genitourinary: Negative.   Musculoskeletal: Negative.   Skin: Negative.   Neurological: Negative.   Endo/Heme/Allergies: Negative.   Psychiatric/Behavioral:  Positive for depression, hallucinations, substance abuse and suicidal ideas. The patient is nervous/anxious.   Blood pressure 112/78, pulse 86, temperature 98.4 F (36.9 C), temperature source Oral, resp. rate 18, SpO2 98 %. There is no height or weight on file to calculate BMI.  Musculoskeletal: Strength & Muscle Tone: within normal limits Gait & Station: normal Patient leans: N/A   [facility] MSE Discharge Disposition for Follow up and Recommendations: Based on my evaluation I certify that psychiatric inpatient services furnished can reasonably be expected to improve the patient's condition which I recommend transfer to an appropriate accepting facility.  Patient will be transferred to emergency room for medical clearance for psychiatric hospitalization.  Spoke and sent secure message to Dr. Rodena Medin at [facility],  ED informing of above assessment and patient needing to be medically cleared.  Kilie Rund   Cheridan Kibler, NP 09/23/2020, 2:26 PM

## 2020-09-24 MED ORDER — TRAZODONE HCL 50 MG PO TABS: 50.0000 mg | ORAL_TABLET | Freq: Every evening | ORAL | Status: DC | PRN

## 2020-09-24 MED ORDER — ACETAMINOPHEN 500 MG PO TABS
1000.0000 mg | ORAL_TABLET | Freq: Four times a day (QID) | ORAL | Status: DC | PRN
Start: 2020-09-24 — End: 2020-09-24
  Administered 2020-09-24: 1000 mg via ORAL
  Filled 2020-09-24: qty 2

## 2020-09-24 MED ORDER — GABAPENTIN 600 MG PO TABS: 300.0000 mg | ORAL_TABLET | Freq: Two times a day (BID) | ORAL | Status: DC

## 2020-09-24 MED ORDER — LEVETIRACETAM 500 MG PO TABS: 500.0000 mg | ORAL_TABLET | Freq: Two times a day (BID) | ORAL | Status: DC

## 2020-09-24 MED ORDER — OLANZAPINE 5 MG PO TABS: 5.0000 mg | ORAL_TABLET | Freq: Every day | ORAL | Status: DC

## 2020-09-24 NOTE — ED Notes (Signed)
Pt given all his belongings. Pt agreed and signed personal  belongings inventory sheet.

## 2020-09-24 NOTE — ED Notes (Addendum)
Called [facility] regarding pt's admission. They will call me back if they have an update. Tried to call the number that was on the social worker's note last night but I was transferred to a Casimiro Needle who has no update at this time.

## 2020-09-24 NOTE — ED Provider Notes (Signed)
Emergency Medicine Observation Re-evaluation Note  Eddi Dermody is a 45 y.o. male, seen on rounds today.  Pt initially presented to the ED for complaints of behavioral health evaluation, and issues related to substance abuse. Today, pt reports feeling much improved, and requests d/c - states does not feeling inpatient [facility] stay would be of any help, and expressed willingness to follow up as outpt.   Physical Exam  BP 117/76 (BP Location: Left Arm)   Pulse 78   Temp 98.2 F (36.8 C) (Oral)   Resp 18   SpO2 98%  Physical Exam General: calm, cooperative, conversant.  Cardiac: regular rate Lungs: breathing comfortably Psych: normal mood and affect, smiling, conversant. Pt says feels well, at baseline today. He does not appear acutely depressed. He denies any desire or thoughts of harming self, no SI. No thoughts of harm to others. Pt is not responding to internal stimuli - no delusions or hallucinations are noted. His thought processes appear clear.   ED Course / MDM    I have reviewed the labs performed to date as well as medications administered while in observation.  Recent changes in the last 24 hours include ED observation, metabolism of substances, and  reassessment.   Plan  Pt reports feeling much improved, at baseline, and requests d/c, and declines offer for inpatient [facility] treatment. He exhibits normal mood and affect, and has no thoughts of harm to self or others. There is no acute psychosis.   Pt is eating lunch, appear well/stable, normal vitals and persists in desire to be d/cd from ED. He indicates is willing to f/u as outpt.   Pt currently appears stable for d/c.   Return precautions provided.         Cathren Laine, MD 09/24/20 [id number]

## 2020-09-24 NOTE — ED Notes (Signed)
Pt belongings obtained and inventoried. Items (2 bags) placed in locker #14.

## 2020-09-24 NOTE — Discharge Instructions (Addendum)
It was our pleasure to provide your ER care today - we hope that you feel better.  Avoid meth/substance use as it is hazardous to your physical health and mental well-being.   Drink plenty of fluids/stay well hydrated, eat balanced diet.   Follow up with primary care doctor and behavioral health specialist in the coming week. Also see resource guide provided as relates accessing substance use treatment and other behavioral health programs, and social service resources.   For mental health issues and/or crisis, you may also go directly to the [facility] -  it is open 24/7 and walk-ins are welcome.   Return to ER if worse, new symptoms, fevers, trouble breathing, seizures, or other emergency concern.

## 2020-09-24 NOTE — ED Notes (Signed)
Placed Breakfast order 

## 2022-01-04 DEATH — deceased
